# Patient Record
Sex: Female | Born: 1990
Health system: Southern US, Community
[De-identification: ages and names within clinical notes are randomized; demographics above are authoritative.]

## PROBLEM LIST (undated history)

## (undated) DIAGNOSIS — F419 Anxiety disorder, unspecified: Secondary | ICD-10-CM

## (undated) DIAGNOSIS — F410 Panic disorder [episodic paroxysmal anxiety] without agoraphobia: Secondary | ICD-10-CM

## (undated) DIAGNOSIS — F909 Attention-deficit hyperactivity disorder, unspecified type: Secondary | ICD-10-CM

## (undated) HISTORY — DX: Panic disorder (episodic paroxysmal anxiety): F41.0

## (undated) HISTORY — DX: Attention-deficit hyperactivity disorder, unspecified type: F90.9

## (undated) HISTORY — PX: NO PAST SURGERIES: SHX2092

## (undated) HISTORY — DX: Anxiety disorder, unspecified: F41.9

---
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
## 2010-02-26 ENCOUNTER — Ambulatory Visit: Payer: Self-pay

## 2010-07-15 ENCOUNTER — Ambulatory Visit: Payer: Self-pay | Admitting: Surgery

## 2011-06-10 ENCOUNTER — Emergency Department: Payer: Self-pay | Admitting: Emergency Medicine

## 2012-01-21 ENCOUNTER — Ambulatory Visit: Payer: Self-pay | Admitting: Family Medicine

## 2012-01-27 ENCOUNTER — Ambulatory Visit: Payer: Self-pay | Admitting: Family Medicine

## 2012-02-02 ENCOUNTER — Ambulatory Visit: Payer: Self-pay | Admitting: Family Medicine

## 2012-02-09 ENCOUNTER — Ambulatory Visit: Payer: Self-pay | Admitting: Internal Medicine

## 2012-02-09 LAB — CBC CANCER CENTER
Basophil #: 0 x10 3/mm (ref 0.0–0.1)
Basophil %: 0.6 %
Eosinophil %: 1.8 %
Lymphocyte %: 39.1 %
MCH: 29.7 pg (ref 26.0–34.0)
MCHC: 33 g/dL (ref 32.0–36.0)
Monocyte #: 0.3 x10 3/mm (ref 0.2–0.9)
Monocyte %: 5.1 %
Neutrophil #: 3.4 x10 3/mm (ref 1.4–6.5)
Platelet: 258 x10 3/mm (ref 150–440)
RDW: 12.6 % (ref 11.5–14.5)

## 2012-02-09 LAB — HEPATIC FUNCTION PANEL A (ARMC)
Albumin: 4.2 g/dL (ref 3.4–5.0)
Alkaline Phosphatase: 78 U/L (ref 50–136)
Bilirubin, Direct: 0.1 mg/dL (ref 0.00–0.20)
SGOT(AST): 32 U/L (ref 15–37)
SGPT (ALT): 36 U/L (ref 12–78)

## 2012-02-09 LAB — CREATININE, SERUM
Creatinine: 0.77 mg/dL (ref 0.60–1.30)
EGFR (African American): >60
EGFR (Non-African Amer.): >60

## 2012-02-09 LAB — TSH: Thyroid Stimulating Horm: 1.05 u[IU]/mL

## 2012-02-10 LAB — CEA: CEA: 1.7 ng/mL (ref 0.0–4.7)

## 2012-02-10 LAB — CA 125: CA 125: 11.6 U/mL (ref 0.0–34.0)

## 2012-02-10 LAB — AFP TUMOR MARKER: AFP-Tumor Marker: <0.7 ng/mL (ref 0.0–8.3)

## 2012-02-10 LAB — CANCER ANTIGEN 27.29: CA 27.29: 19.3 U/mL (ref 0.0–38.6)

## 2012-02-24 LAB — CBC CANCER CENTER
Basophil %: 0.5 %
Eosinophil #: 0.1 x10 3/mm (ref 0.0–0.7)
Lymphocyte #: 2.8 x10 3/mm (ref 1.0–3.6)
Lymphocyte %: 32.6 %
MCH: 31 pg (ref 26.0–34.0)
MCHC: 35.1 g/dL (ref 32.0–36.0)
Monocyte #: 0.4 x10 3/mm (ref 0.2–0.9)
Neutrophil %: 61 %
Platelet: 283 x10 3/mm (ref 150–440)
RDW: 12.3 % (ref 11.5–14.5)
WBC: 8.6 x10 3/mm (ref 3.6–11.0)

## 2012-02-24 LAB — HCG, QUANTITATIVE, PREGNANCY: Beta Hcg, Quant.: <1 m[IU]/mL — ABNORMAL LOW

## 2012-02-29 ENCOUNTER — Ambulatory Visit: Payer: Self-pay | Admitting: Internal Medicine

## 2012-03-01 LAB — CANCER ANTIGEN 19-9: CA 19-9: 20 U/mL (ref 0–35)

## 2012-03-09 ENCOUNTER — Ambulatory Visit: Payer: Self-pay | Admitting: Internal Medicine

## 2012-03-14 ENCOUNTER — Ambulatory Visit: Payer: Self-pay

## 2012-05-03 ENCOUNTER — Ambulatory Visit: Payer: Self-pay | Admitting: Internal Medicine

## 2013-03-28 DIAGNOSIS — IMO0001 Reserved for inherently not codable concepts without codable children: Secondary | ICD-10-CM | POA: Insufficient documentation

## 2013-03-28 DIAGNOSIS — K219 Gastro-esophageal reflux disease without esophagitis: Secondary | ICD-10-CM

## 2013-03-28 DIAGNOSIS — N6009 Solitary cyst of unspecified breast: Secondary | ICD-10-CM | POA: Insufficient documentation

## 2014-02-04 IMAGING — US ABDOMEN ULTRASOUND LIMITED
1 series · 14 of 25 positions shown · non-contrast
Comparison: none

REASON FOR EXAM: LUQ abd pain x 9 mos worsening in last month worsens w
exercise
COMMENTS:

PROCEDURE:     TIGER - TIGER ABDOMEN UPPER GENERAL  - January 21, 2012  [DATE]
RESULT:     Comparison: None.
TECHNIQUE: Multiple grayscale and color Doppler images were obtained of the
abdomen.

[Series 1: abdomen ultrasound limited · 0.25mm/px · 14 of 101 slices shown]
[im 1/101]
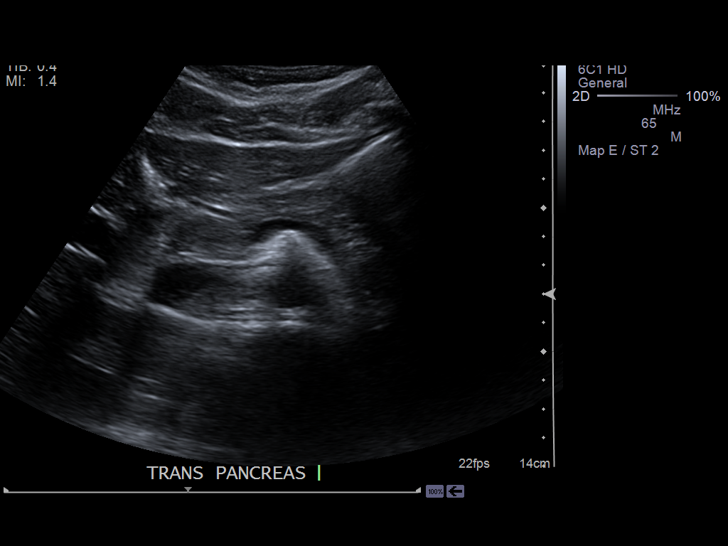
[im 9/101]
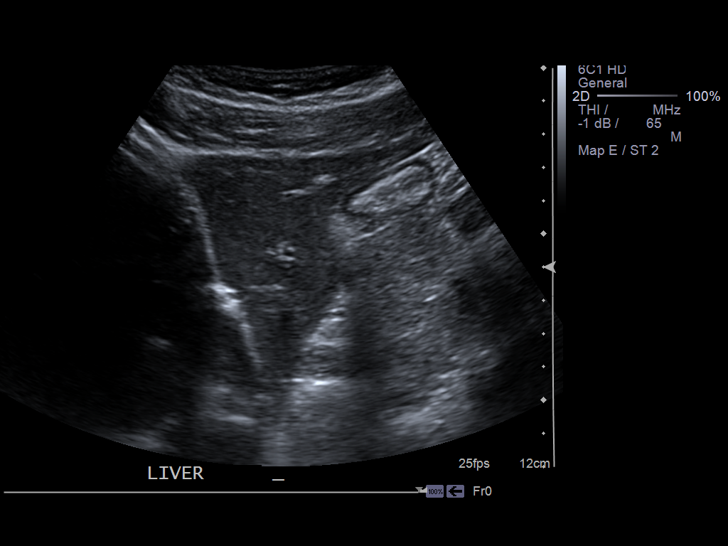
[im 17/101]
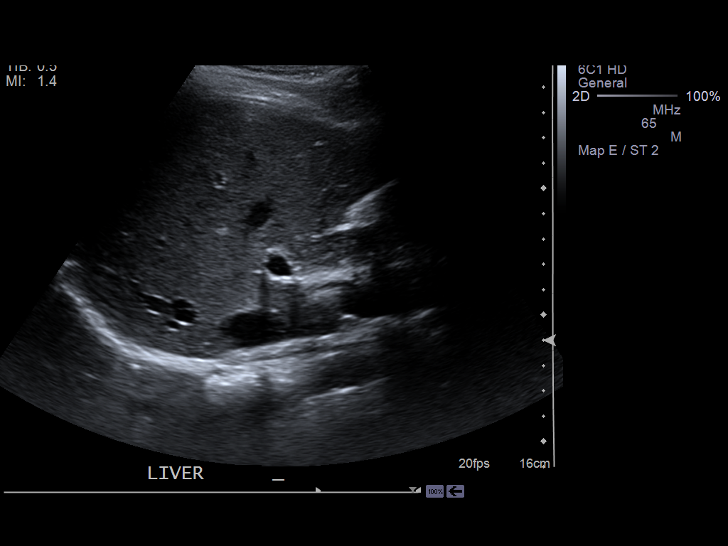
[im 26/101]
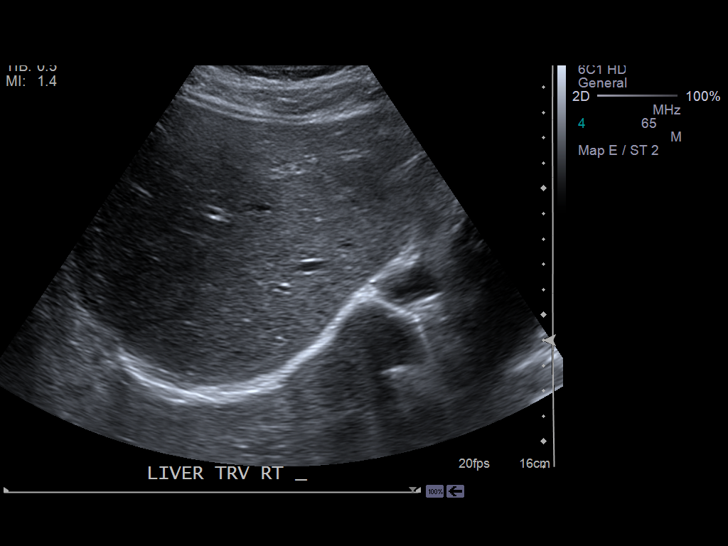
[im 34/101]
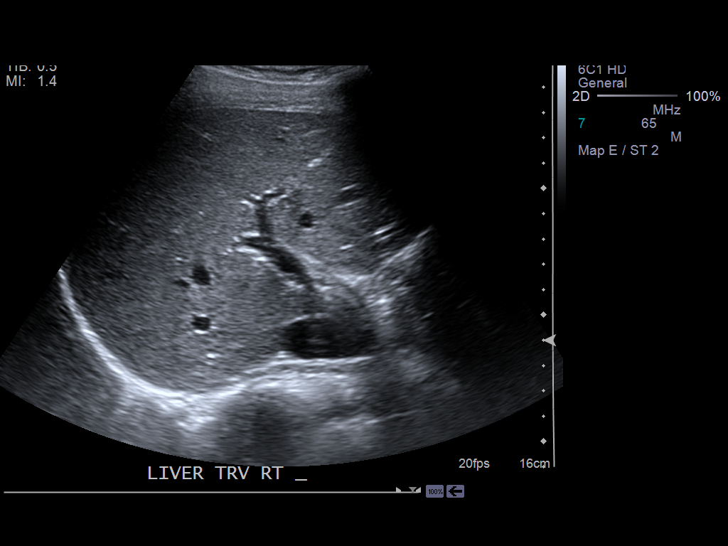
[im 38/101]
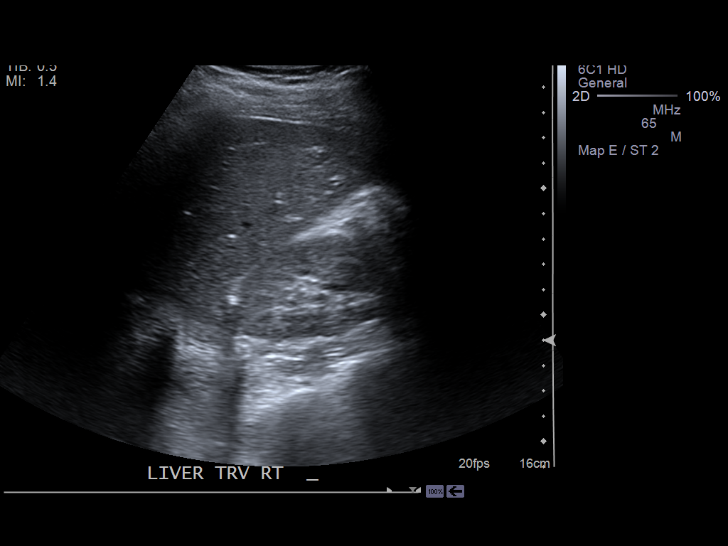
[im 46/101]
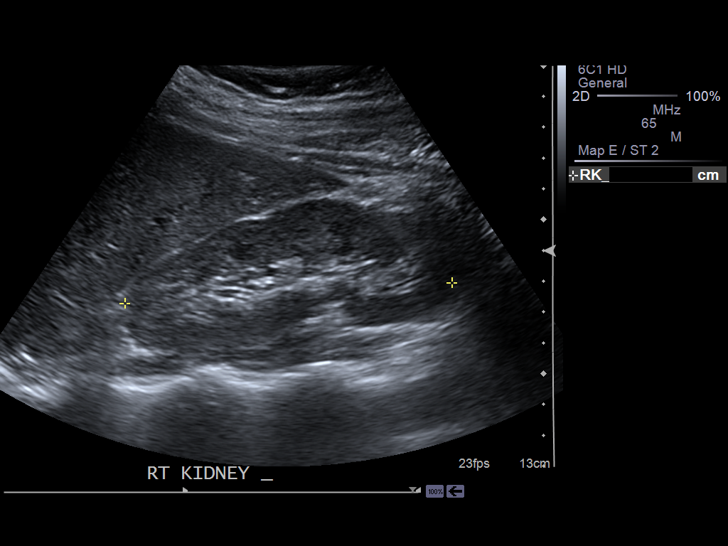
[im 55/101]
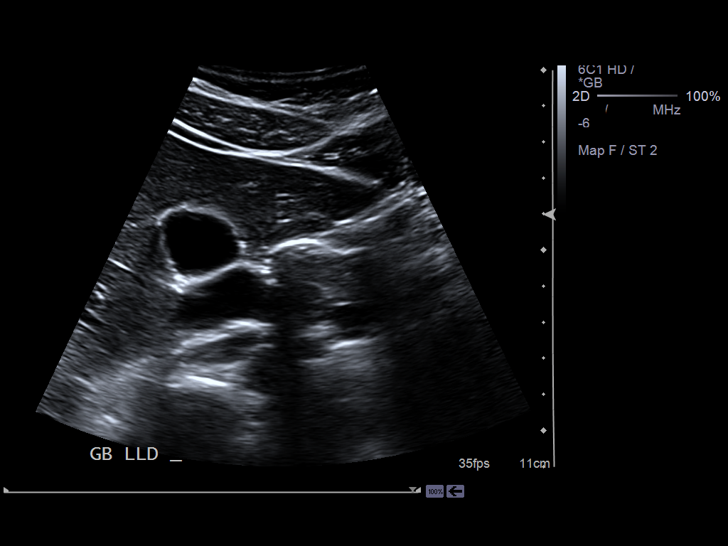
[im 63/101]
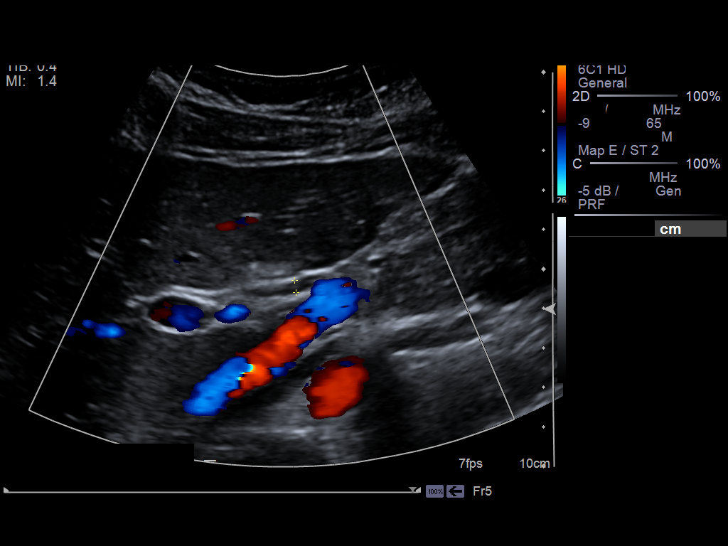
[im 67/101]
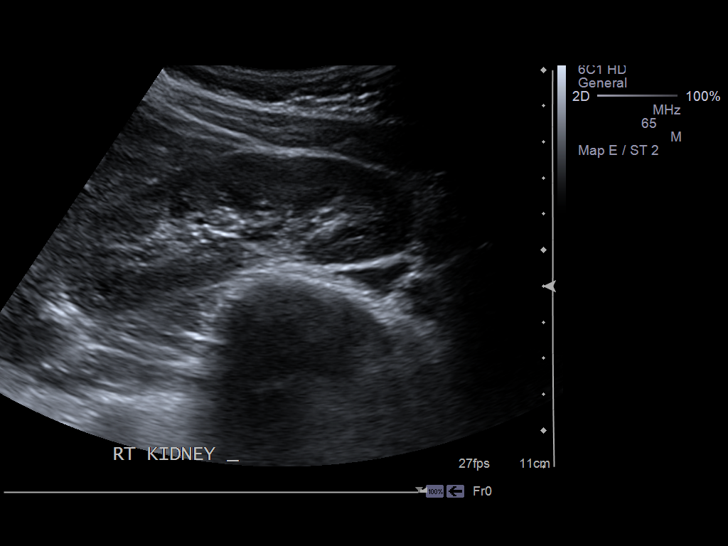
[im 76/101]
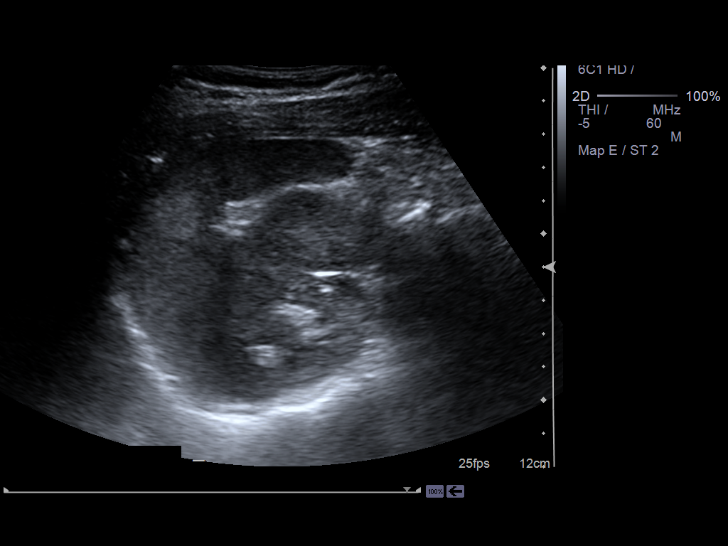
[im 84/101]
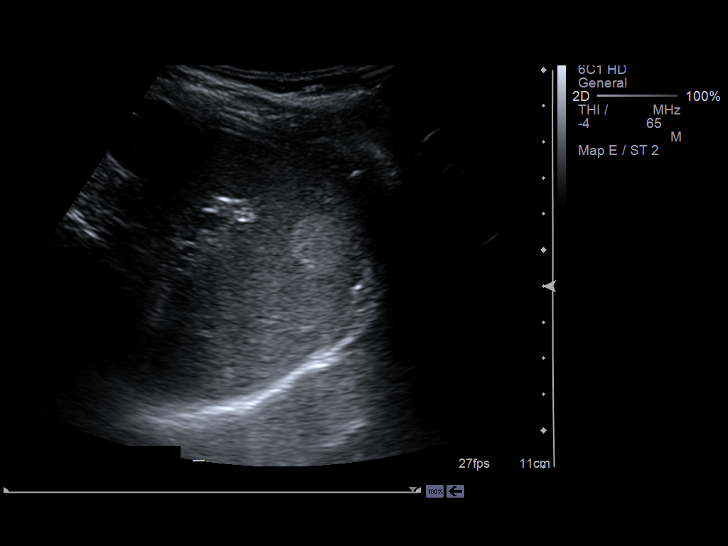
[im 92/101]
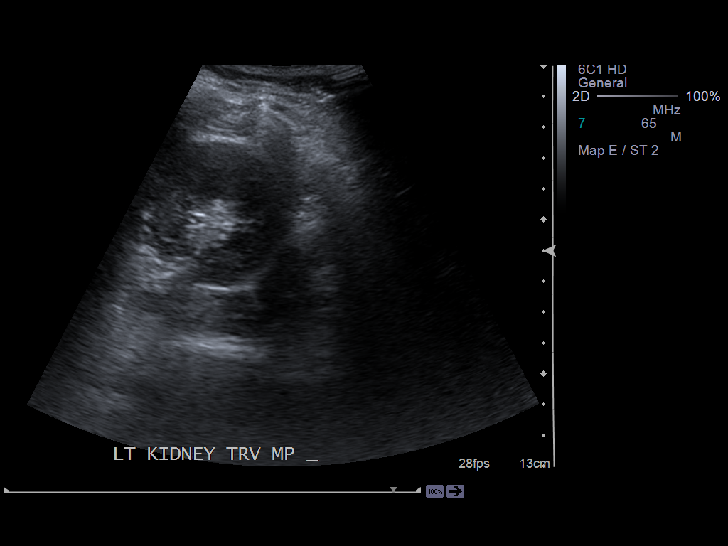
[im 101/101]
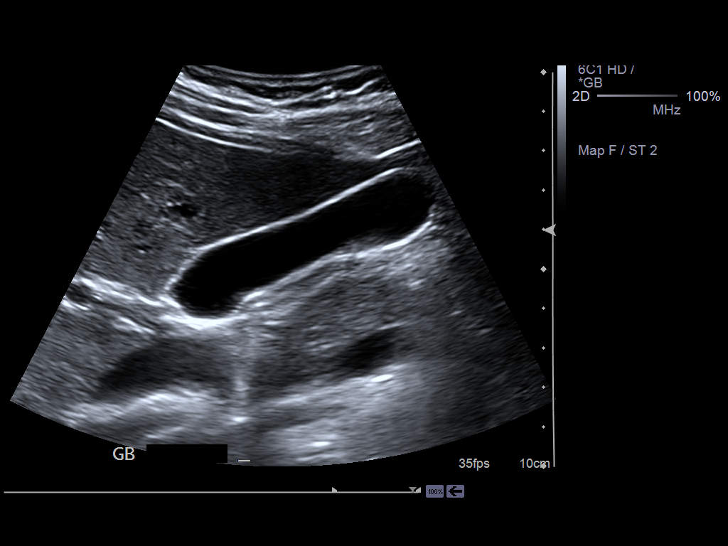

[14 of 25 positions shown; findings below may reference images not displayed]

FINDINGS: The visualized pancreas and liver are unremarkable. The main portal vein is
patent. The gallbladder is normal. Sonographic Murphy sign was negative. The
common bile duct measures 3 mm in diameter.

Images of the kidneys showed no hydronephrosis. There is a round hyperechoic
mass within the spleen. It measures 1.6 x 1.5 x 1.5 cm. On some images,
there appears to be acoustic through admission. The spleen measures 9 cm in
greatest dimension.
IMPRESSION: 1. No acute findings.
2. Small round hyperechoic mass within the spleen is nonspecific. This
likely represents a hemangioma, but is technically indeterminate. Further
evaluation with contrast-enhanced abdominal MRI is recommended.

[REDACTED]

## 2014-03-15 IMAGING — CT NM PET TUM IMG LTD AREA
1 of 5 series · 8 of 25 positions shown · non-contrast
Comparison: none

REASON FOR EXAM: abn CT spleen abd pain reflux nausea
COMMENTS:

[Series 3: ct wb 3.0 b30f · axial · 3.0mm · 0.98mm/px · z∈[-1406,-638]mm · 8 of 494 slices shown]
[im 55/494  soft-tissue]
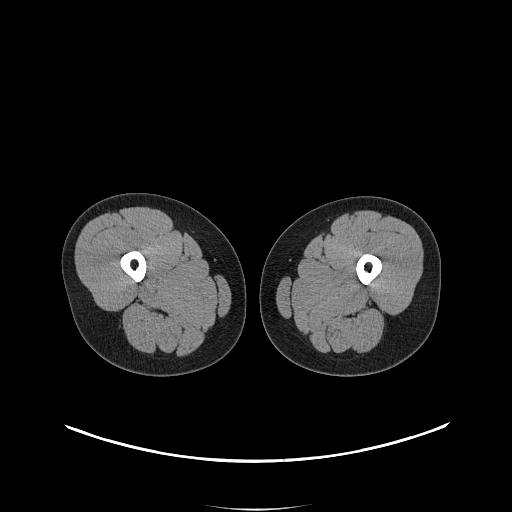
[im 110/494  soft-tissue]
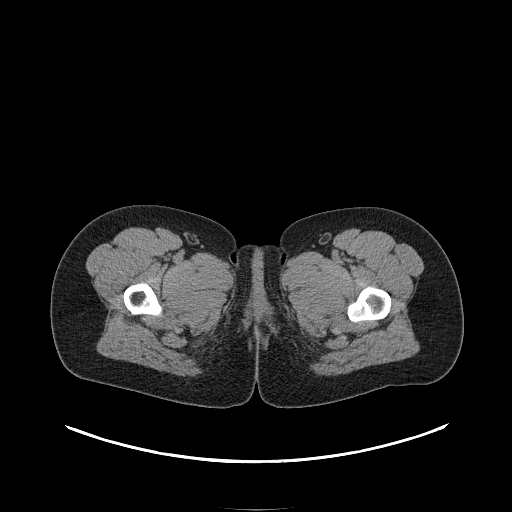
[im 165/494  soft-tissue]
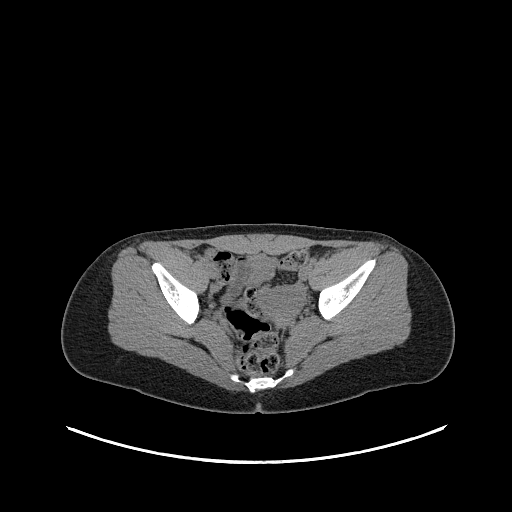
[im 220/494  soft-tissue]
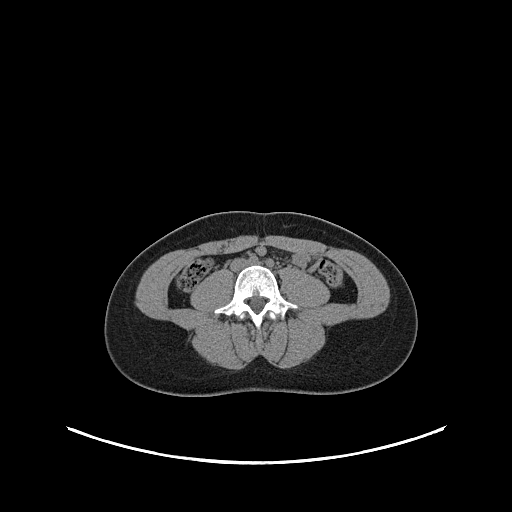
[im 274/494  soft-tissue]
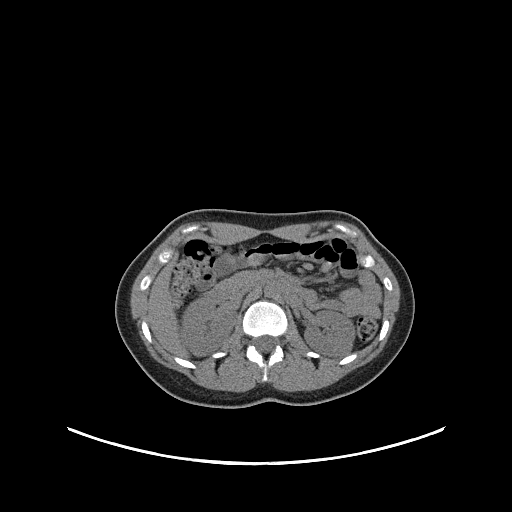
[im 329/494  soft-tissue]
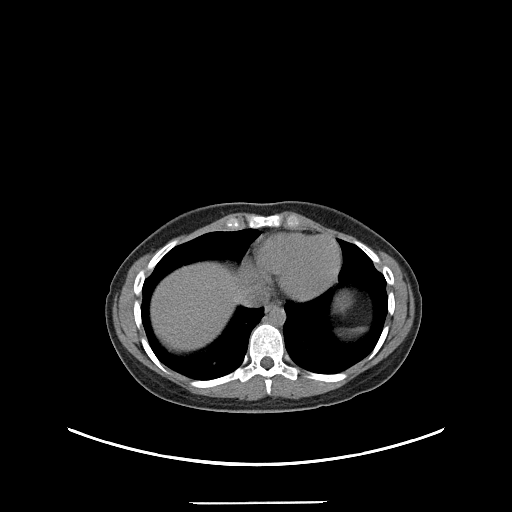
[im 384/494  soft-tissue]
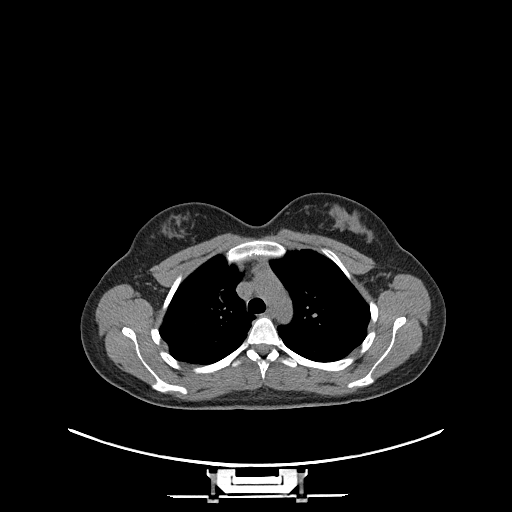
[im 439/494  soft-tissue]
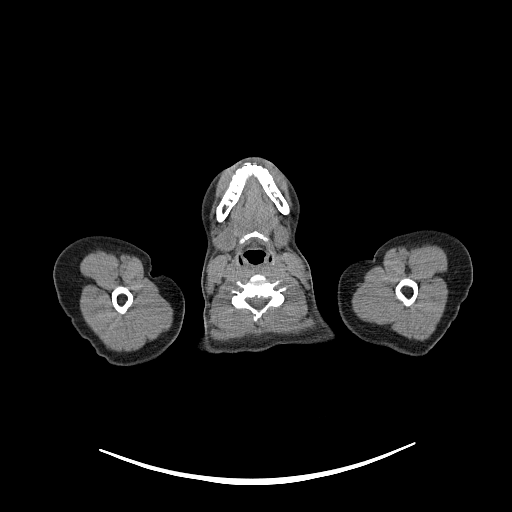

[8 of 25 positions shown; findings below may reference images not displayed]

PROCEDURE:     PET - PET/CT DX LYMPHOMA  - February 29, 2012  [DATE]

RESULT:     The patient is undergoing evaluation of possible lymphoma. The
patient's fasting blood glucose level was 91 mg/dL. The patient received
13.2 mCi of F-18 labeled FDG at [DATE] p.m. with scanning beginning at [DATE]
p.m. A noncontrast CT scan was performed at the same sitting for
coregistration and attenuation correction.

Uptake of the radiopharmaceutical within the neck is normal. Within the
thorax there is low level increased uptake in the retrosternal soft tissues.
It exhibits maximal SUV of 3 with a mean of 1.6 o hilar or subcarinal uptake
is demonstrated. There is no abnormal uptake in the pulmonary parenchyma.

Within the abdomen there is normally expected activity within the urinary
tracts. There is a tiny amount of increased uptake demonstrated at the level
of the GE junction which is felt to be physiologic. There is no abnormal
uptake within the spleen. No abnormal uptake is demonstrated in the
periaortic or pericaval regions. No significant bowel uptake is demonstrated.

On the CT images at lung window settings there is no interstitial nor
alveolar infiltrate nor pulmonary masses demonstrated. There is mildly
increased density in the retrosternal fat without evidence of a mass. No
mediastinal or hilar or axillary lymphadenopathy is demonstrated.

Within the abdomen the spleen is not enlarged. There are hypodensities
within the spleen is a been previously demonstrated which do not exhibit
abnormally increased uptake. The kidneys exhibit no acute abnormalities. The
unopacified loops of small and large bowel exhibit no acute abnormality. The
structures of the pelvis appear normal for age.
IMPRESSION: 1. I do not see abnormal uptake of the radiopharmaceutical within the neck
nor within the abdomen or pelvis.
2. There is low level increased uptake in the retrosternal fat and may
reflect pathology.
3. There is no splenomegaly. There is hypodensity within the spleen which is
nonspecific.

[REDACTED]

## 2014-05-29 LAB — HM PAP SMEAR: HM PAP: NEGATIVE

## 2014-07-16 LAB — BASIC METABOLIC PANEL
BUN: 6 mg/dL (ref 4–21)
Creatinine: 0.6 mg/dL (ref 0.5–1.1)
Glucose: 89 mg/dL
Potassium: 4.3 mmol/L (ref 3.4–5.3)
Sodium: 140 mmol/L (ref 137–147)

## 2014-07-16 LAB — HEPATIC FUNCTION PANEL
ALT: 11 U/L (ref 7–35)
AST: 13 U/L (ref 13–35)

## 2014-07-16 LAB — CBC AND DIFFERENTIAL
HEMATOCRIT: 44 % (ref 36–46)
HEMOGLOBIN: 14.9 g/dL (ref 12.0–16.0)
Platelets: 339 10*3/uL (ref 150–399)
WBC: 6.6 10^3/mL

## 2014-07-17 ENCOUNTER — Other Ambulatory Visit: Payer: Self-pay | Admitting: Family Medicine

## 2014-07-17 DIAGNOSIS — R1010 Upper abdominal pain, unspecified: Secondary | ICD-10-CM

## 2014-07-20 ENCOUNTER — Ambulatory Visit: Payer: Self-pay

## 2014-07-25 ENCOUNTER — Ambulatory Visit
Admission: RE | Admit: 2014-07-25 | Discharge: 2014-07-25 | Disposition: A | Discharge status: Home / Self Care | Payer: BLUE CROSS/BLUE SHIELD | Source: Ambulatory Visit | Attending: Family Medicine | Admitting: Family Medicine

## 2014-07-25 DIAGNOSIS — R1010 Upper abdominal pain, unspecified: Secondary | ICD-10-CM

## 2014-07-25 DIAGNOSIS — D7389 Other diseases of spleen: Secondary | ICD-10-CM | POA: Diagnosis not present

## 2014-07-25 DIAGNOSIS — R109 Unspecified abdominal pain: Secondary | ICD-10-CM | POA: Insufficient documentation

## 2014-07-25 MED ORDER — IOHEXOL 350 MG/ML SOLN
100.0000 mL | Freq: Once | INTRAVENOUS | Status: AC | PRN
  Administered 2014-07-25: 100 mL via INTRAVENOUS

## 2014-11-02 ENCOUNTER — Encounter: Payer: Self-pay | Admitting: Family Medicine

## 2014-11-02 ENCOUNTER — Ambulatory Visit (INDEPENDENT_AMBULATORY_CARE_PROVIDER_SITE_OTHER): Payer: Self-pay | Admitting: Family Medicine

## 2014-11-02 VITALS — BP 108/70 | HR 90 | Temp 98.4°F | Resp 18 | Ht 65.0 in | Wt 157.0 lb

## 2014-11-02 DIAGNOSIS — F410 Panic disorder [episodic paroxysmal anxiety] without agoraphobia: Secondary | ICD-10-CM | POA: Insufficient documentation

## 2014-11-02 DIAGNOSIS — F419 Anxiety disorder, unspecified: Secondary | ICD-10-CM | POA: Insufficient documentation

## 2014-11-02 DIAGNOSIS — I456 Pre-excitation syndrome: Secondary | ICD-10-CM

## 2014-11-02 DIAGNOSIS — R000 Tachycardia, unspecified: Secondary | ICD-10-CM

## 2014-11-02 DIAGNOSIS — E785 Hyperlipidemia, unspecified: Secondary | ICD-10-CM

## 2014-11-02 DIAGNOSIS — R002 Palpitations: Secondary | ICD-10-CM

## 2014-11-02 NOTE — Progress Notes (Signed)
Patient: Tracey Robinson Female    DOB: 06/03/1990   24 y.o.   MRN: [id number] Visit Date: 11/02/2014  Today's Provider: Mila Merry, MD   Chief Complaint  Patient presents with  . Dizziness   Subjective:    Dizziness This is a recurrent problem. The current episode started more than 1 year ago (started while in high school 6-7 years ago). The problem occurs intermittently. The problem has been gradually worsening. Pertinent negatives include no abdominal pain, anorexia, arthralgias, change in bowel habit, chest pain, chills, congestion, coughing, diaphoresis, fever, headaches, joint swelling, myalgias, nausea, neck pain, numbness, rash, sore throat, swollen glands, urinary symptoms, vertigo, visual change, vomiting or weakness. Exacerbated by: exercise.   Patient states she has noticed that when she does Cardio exercise she started to feel like she is about to pass out. Patient states most recently she was on an eliptical for 20 minutes and suddenly felt like she was about to faint. Patient states she started to get her color back, but did not pass out. Patient states her hear rate was 170 and her heart rate remained over 140 for two hours. She denies any recent change in diet, medications, or supplements. Has been trying to exercise due to high cholesterol, but has been limited by Patient states she was told by her GYN 2 years ago that she has high cholesterol. Patient states she does have a family history of heart. .      Allergies  Allergen Reactions  . Penicillins Rash   Previous Medications   ALPRAZOLAM (XANAX) 0.25 MG TABLET    Take 1 tablet by mouth every 6 (six) hours as needed.   NORETHINDRONE ACET-ETHINYL EST (MICROGESTIN 1.5/30 PO)    Take 1 tablet by mouth daily.   RANITIDINE (ZANTAC) 150 MG TABLET    Take 150 mg by mouth at bedtime.    Review of Systems  Constitutional: Negative for fever, chills and diaphoresis.  HENT: Negative for congestion and sore throat.     Respiratory: Negative for cough.   Cardiovascular: Positive for palpitations. Negative for chest pain and leg swelling.  Gastrointestinal: Negative for nausea, vomiting, abdominal pain, anorexia and change in bowel habit.  Musculoskeletal: Negative for myalgias, joint swelling, arthralgias and neck pain.  Skin: Negative for rash.  Neurological: Positive for dizziness. Negative for vertigo, syncope, weakness, numbness and headaches.    Social History  Substance Use Topics  . Smoking status: Never Smoker   . Smokeless tobacco: Not on file  . Alcohol Use: 0.0 oz/week    0 Standard drinks or equivalent per week     Comment: occasional glass of wine   Objective:   BP 108/70 mmHg  Pulse 90  Temp(Src) 98.4 F (36.9 C) (Oral)  Resp 18  Ht  (1.651 m)  Wt 157 lb (71.215 kg)  BMI 26.13 kg/m2  SpO2 98%  Physical Exam   General Appearance:    Alert, cooperative, no distress  Eyes:    PERRL, conjunctiva/corneas clear, EOM's intact       Lungs:     Clear to auscultation bilaterally, respirations unlabored  Heart:    Regular rate and rhythm, no murmurs, rubs or gallops.   Neurologic:   Awake, alert, oriented x 3. No apparent focal neurological           defect.      EKG:  Sinus  Rhythm  -Short PR syndrome  PRi = 112 BORDERLINE RHYTHM No previous EKG  available for comparison.      Assessment & Plan:     1. Palpitation  - EKG 12-Lead  2. Short PR-normal QRS complex syndrome   3. Tachycardia Possibly secondary to short PR syndrome. Check labs. If normal will refer to cardiology as symptoms are having significant morbidity impacting her ability to exercise and be physically active.  - Renal function panel - T4 AND TSH - Magnesium  4. Hyperlipidemia  - Lipid panel       Mila Merry, MD  [facility]

## 2014-11-03 ENCOUNTER — Other Ambulatory Visit: Payer: Self-pay | Admitting: Family Medicine

## 2014-11-03 DIAGNOSIS — R002 Palpitations: Secondary | ICD-10-CM

## 2014-11-03 DIAGNOSIS — I456 Pre-excitation syndrome: Secondary | ICD-10-CM

## 2014-11-03 DIAGNOSIS — R000 Tachycardia, unspecified: Secondary | ICD-10-CM

## 2014-11-03 LAB — LIPID PANEL
Chol/HDL Ratio: 4.1 ratio units (ref 0.0–4.4)
Cholesterol, Total: 236 mg/dL — ABNORMAL HIGH (ref 100–199)
HDL: 58 mg/dL (ref >39)
LDL Calculated: 150 mg/dL — ABNORMAL HIGH (ref 0–99)
TRIGLYCERIDES: 140 mg/dL (ref 0–149)
VLDL Cholesterol Cal: 28 mg/dL (ref 5–40)

## 2014-11-03 LAB — RENAL FUNCTION PANEL
Albumin: 4.2 g/dL (ref 3.5–5.5)
BUN/Creatinine Ratio: 10 (ref 8–20)
BUN: 8 mg/dL (ref 6–20)
CALCIUM: 9.5 mg/dL (ref 8.7–10.2)
CHLORIDE: 101 mmol/L (ref 97–108)
CO2: 23 mmol/L (ref 18–29)
CREATININE: 0.78 mg/dL (ref 0.57–1.00)
GFR calc Af Amer: 123 mL/min/{1.73_m2} (ref >59)
GFR calc non Af Amer: 107 mL/min/{1.73_m2} (ref >59)
GLUCOSE: 88 mg/dL (ref 65–99)
PHOSPHORUS: 3.2 mg/dL (ref 2.5–4.5)
POTASSIUM: 4.3 mmol/L (ref 3.5–5.2)
SODIUM: 139 mmol/L (ref 134–144)

## 2014-11-03 LAB — T4 AND TSH
T4 TOTAL: 10.7 ug/dL (ref 4.5–12.0)
TSH: 1.91 u[IU]/mL (ref 0.450–4.500)

## 2014-11-03 LAB — MAGNESIUM: Magnesium: 2.1 mg/dL (ref 1.6–2.3)

## 2014-11-03 NOTE — Progress Notes (Unsigned)
Please refer cardiology for episodic tachycardia and short PR syndrome.

## 2014-11-05 ENCOUNTER — Telehealth: Payer: Self-pay | Admitting: Family Medicine

## 2014-11-05 ENCOUNTER — Telehealth: Payer: Self-pay

## 2014-11-05 NOTE — Telephone Encounter (Signed)
Please advise patient of result note. Thanks.

## 2014-11-05 NOTE — Telephone Encounter (Signed)
Patient Advised about the labs. 11/05/14 Cholesterol is moderately elevated at 236, but not in range that medications are recommended. Other labs are normal. Need to go ahead and refer to cardiologist regarding tachycardia and short PR syndrome. Have sent referral order to sar[facility].  Thanks,  -Jadeyn Hargett

## 2014-11-05 NOTE — Telephone Encounter (Signed)
Pt would like to get results from blood work °

## 2014-11-06 DIAGNOSIS — R000 Tachycardia, unspecified: Secondary | ICD-10-CM | POA: Insufficient documentation

## 2014-11-27 ENCOUNTER — Other Ambulatory Visit: Payer: Self-pay

## 2014-11-29 ENCOUNTER — Encounter: Payer: Self-pay | Admitting: Gastroenterology

## 2014-11-29 ENCOUNTER — Ambulatory Visit (INDEPENDENT_AMBULATORY_CARE_PROVIDER_SITE_OTHER): Payer: Commercial Managed Care - HMO | Admitting: Gastroenterology

## 2014-11-29 VITALS — BP 104/68 | HR 97 | Temp 98.5°F | Ht 65.0 in | Wt 158.0 lb

## 2014-11-29 DIAGNOSIS — R1012 Left upper quadrant pain: Secondary | ICD-10-CM | POA: Diagnosis not present

## 2014-11-29 MED ORDER — PANTOPRAZOLE SODIUM 40 MG PO TBEC: 40.0000 mg | DELAYED_RELEASE_TABLET | Freq: Every day | ORAL | Status: DC

## 2014-11-29 NOTE — Progress Notes (Signed)
Gastroenterology Consultation  Referring Provider:     No ref. provider found Primary Care Physician:  Mila Merry, MD Primary Gastroenterologist:  Dr. Servando Snare     Reason for Consultation:     Abdominal pain and heartburn        HPI:   Tracey Robinson is a 24 y.o. y/o female referred for consultation & management of abdominal pain and heartburn by Dr. Mila Merry, MD.  This patient comes in today with a report of left-sided abdominal pain with heartburn. The patient states that when she lays on her stomach she feels that there is a bulge there and she thinks her stomach is swollen. The patient had imaging that did not show any abnormality in her stomach or intestines to explain the sensation she is having. The patient denies any unexplained weight loss. She does report that she has some bloating. There is no report of any black stools or bloody stools. The patient takes Zantac before she goes to sleep and reports that she doesn't take it she has more symptoms at night. She reports that she has had these symptoms for a few years. 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  Past Medical History  Diagnosis Date  . Anxiety   . Panic attack     History reviewed. No pertinent past surgical history.  Prior to Admission medications   Medication Sig Start Date End Date Taking? Authorizing Provider  ALPRAZolam (XANAX) 0.25 MG tablet Take 1 tablet by mouth every 6 (six) hours as needed. 07/16/14  Yes Historical Provider, MD  fexofenadine (ALLEGRA) 180 MG tablet Take by mouth.   Yes Historical Provider, MD  fluticasone (FLONASE) 50 MCG/ACT nasal spray Place into the nose.   Yes Historical Provider, MD  MICROGESTIN FE 1.5/30 1.5-30 MG-MCG tablet TK 1 T PO QD 09/17/14  Yes Historical Provider, MD  ranitidine (ZANTAC) 150 MG tablet Take 150 mg by mouth at bedtime.   Yes Historical Provider, MD  loratadine (CLARITIN) 10 MG tablet Take by mouth.    Historical Provider, MD  mometasone (NASONEX) 50  MCG/ACT nasal spray Place into the nose. 05/29/14 05/29/15  Historical Provider, MD  pantoprazole (PROTONIX) 40 MG tablet Take 1 tablet (40 mg total) by mouth daily. 11/29/14   Midge Minium, MD    Family History  Problem Relation Age of Onset  . Heart attack Paternal Grandfather   . Ulcers Father   . Heart attack Father   . Diabetes Paternal Grandfather   . Anxiety disorder Paternal Grandmother   . Anxiety disorder Father   . Hyperlipidemia Mother      Social History  Substance Use Topics  . Smoking status: Never Smoker   . Smokeless tobacco: Never Used  . Alcohol Use: 0.0 oz/week    0 Standard drinks or equivalent per week     Comment: occasional glass of wine    Allergies as of 11/29/2014 - Review Complete 11/02/2014  Allergen Reaction Noted  . Penicillins Rash 07/25/2014    Review of Systems:    All systems reviewed and negative except where noted in HPI.   Physical Exam:  BP 104/68 mmHg  Pulse 97  Temp(Src) 98.5 F (36.9 C) (Oral)  Ht  (1.651 m)  Wt 158 lb (71.668 kg)  BMI 26.29 kg/m2 No LMP recorded. Psych:  Alert and cooperative. Normal mood and affect. General:   Alert,  Well-developed, well-nourished, pleasant and cooperative in NAD Head:  Normocephalic and atraumatic. Eyes:  Sclera clear, no icterus.   Conjunctiva pink. Ears:  Normal auditory acuity. Nose:  No deformity, discharge, or lesions. Mouth:  No deformity or lesions,oropharynx pink & moist. Neck:  Supple; no masses or thyromegaly. Lungs:  Respirations even and unlabored.  Clear throughout to auscultation.   No wheezes, crackles, or rhonchi. No acute distress. Heart:  Regular rate and rhythm; no murmurs, clicks, rubs, or gallops. Abdomen:  Normal bowel sounds.  No bruits.  Soft, Mild tenderness in the left upper quadrant ,non-distended without masses, hepatosplenomegaly or hernias noted.  No guarding or rebound tenderness.  Negative Carnett sign.   Rectal:  Deferred.  Msk:  Symmetrical without  gross deformities.  Good, equal movement & strength bilaterally. Pulses:  Normal pulses noted. Extremities:  No clubbing or edema.  No cyanosis. Neurologic:  Alert and oriented x3;  grossly normal neurologically. Skin:  Intact without significant lesions or rashes.  No jaundice. Lymph Nodes:  No significant cervical adenopathy. Psych:  Alert and cooperative. Normal mood and affect.  Imaging Studies: No results found.  Assessment and Plan:   Tracey Robinson is a 24 y.o. y/o female who comes today with a report of a swollen stomach although the CT scan did not show any sign of intestinal swelling. The patient takes Zantac with some relief of her symptoms but continues to have pain in the left upper quadrant. The pain is worse with fatty foods and spicy foods. The patient will be started on a trial of Protonix. If her symptoms do not improve she may need to be set up for an upper endoscopy and will be considered for a right upper quadrant ultrasound with a HIDA scan. A right upper quadrant ultrasound and HIDA scan for some fat intolerance and intermittent right upper quadrant pain.  Note: This dictation was prepared with Dragon dictation along with smaller phrase technology. Any transcriptional errors that result from this process are unintentional.

## 2015-01-01 ENCOUNTER — Encounter: Payer: Self-pay | Admitting: Family Medicine

## 2015-01-01 ENCOUNTER — Ambulatory Visit (INDEPENDENT_AMBULATORY_CARE_PROVIDER_SITE_OTHER): Payer: Commercial Managed Care - HMO | Admitting: Family Medicine

## 2015-01-01 VITALS — BP 100/66 | HR 90 | Temp 98.7°F | Resp 16 | Ht 64.5 in | Wt 158.0 lb

## 2015-01-01 DIAGNOSIS — Z23 Encounter for immunization: Secondary | ICD-10-CM | POA: Diagnosis not present

## 2015-01-01 DIAGNOSIS — F410 Panic disorder [episodic paroxysmal anxiety] without agoraphobia: Secondary | ICD-10-CM

## 2015-01-01 DIAGNOSIS — Z3009 Encounter for other general counseling and advice on contraception: Secondary | ICD-10-CM

## 2015-01-01 DIAGNOSIS — Z0000 Encounter for general adult medical examination without abnormal findings: Secondary | ICD-10-CM | POA: Diagnosis not present

## 2015-01-01 LAB — POCT URINALYSIS DIPSTICK
Bilirubin, UA: NEGATIVE
Blood, UA: NEGATIVE
Glucose, UA: NEGATIVE
KETONES UA: NEGATIVE
LEUKOCYTES UA: NEGATIVE
Nitrite, UA: NEGATIVE
PH UA: 7
PROTEIN UA: NEGATIVE
SPEC GRAV UA: 1.015
UROBILINOGEN UA: 0.2

## 2015-01-01 MED ORDER — DESOGESTREL-ETHINYL ESTRADIOL 0.15-0.02/0.01 MG (21/5) PO TABS: 1.0000 | ORAL_TABLET | Freq: Every day | ORAL | Status: DC

## 2015-01-01 NOTE — Progress Notes (Signed)
Patient: Tracey Robinson, Female    DOB: [birth year], 24 y.o.   MRN: [id number] Visit Date: 01/01/2015  Today's Provider: Lelon Huh, MD   Chief Complaint  Patient presents with  . Annual Exam  . Panic Attack    follow up   Subjective:    Annual physical exam  Tracey Robinson is a 24 y.o. female who presents today for health maintenance and complete physical. She feels well. She reports exercising 3 times a week (yoga). She reports she is sleeping poorly. 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 She states she had gyn exam and pap test done in February with her gynecologist.   She states she would like to try a different birth control pill that would lessen mood swings and severity of menstrual cycle. She has not other adverse effects from her current birth control pill.   -----------------------------------------------------------------   Follow up Anxiety/ Panic Attacks Last office visit was 5 months ago. Changes made during this visit includes prescribing as needed Xanax. Patient states she does not take them regularly. She states she may take it once a month. Patient states states she like to have it just in case she needs it. 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   Review of Systems  Constitutional: Negative for fever, chills and fatigue.  HENT: Negative for congestion, ear pain, rhinorrhea, sneezing and sore throat.   Eyes: Negative.  Negative for pain and redness.  Respiratory: Negative for cough, shortness of breath and wheezing.   Cardiovascular: Negative for chest pain and leg swelling.  Gastrointestinal: Negative for nausea, abdominal pain, diarrhea, constipation and blood in stool.  Endocrine: Negative for polydipsia and polyphagia.  Genitourinary: Negative.  Negative for dysuria, hematuria, flank pain, vaginal bleeding, vaginal discharge and pelvic pain.  Musculoskeletal: Negative for back  pain, joint swelling, arthralgias and gait problem.  Skin: Negative for rash.  Neurological: Negative.  Negative for dizziness, tremors, seizures, weakness, light-headedness, numbness and headaches.  Hematological: Negative for adenopathy.  Psychiatric/Behavioral: Positive for sleep disturbance (trouble staying asleep, falling asleep and sleeping too much). Negative for behavioral problems, confusion and dysphoric mood. The patient is nervous/anxious. The patient is not hyperactive.     Social History She  reports that she has never smoked. She has never used smokeless tobacco. She reports that she drinks alcohol. She reports that she does not use illicit drugs. Social History   Social History  . Marital Status: Single    Spouse Name: N/A  . Number of Children: 0  . Years of Education: N/A   Occupational History  . 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 Smoking status: Never Smoker   . Smokeless tobacco: Never Used  . Alcohol Use: 0.0 oz/week    0 Standard drinks or equivalent per week     Comment: occasional glass of wine  . Drug Use: No  . Sexual Activity: Not Asked   Other Topics Concern  . None   Social History Narrative    Patient Active Problem List   Diagnosis Date Noted  . Abdominal pain 11/27/2014  . Fast heart beat 11/06/2014  . Panic attack 11/02/2014  . Anxiety 11/02/2014  . Panic disorder without agoraphobia 11/02/2014  . Reflux 03/28/2013  . Benign cyst of breast 03/28/2013    History reviewed. No pertinent past surgical history.  Family History  Family Status  Relation Status Death Age  . Mother Alive   . Father Alive    Her family history includes Anxiety disorder in her father and paternal grandmother; Diabetes in her paternal grandfather; Heart attack in her father and paternal grandfather; Hyperlipidemia in her mother; Ulcers in her father.    Allergies  Allergen Reactions  . Penicillins Rash     Previous Medications   ALPRAZOLAM (XANAX) 0.25 MG TABLET    Take 1 tablet by mouth every 6 (six) hours as needed.   FLUTICASONE (FLONASE) 50 MCG/ACT NASAL SPRAY    Place into the nose.   LORATADINE (CLARITIN) 10 MG TABLET    Take by mouth.   MICROGESTIN FE 1.5/30 1.5-30 MG-MCG TABLET    TK 1 T PO QD   PANTOPRAZOLE (PROTONIX) 40 MG TABLET    Take 1 tablet (40 mg total) by mouth daily.   RANITIDINE (ZANTAC) 150 MG TABLET    Take 150 mg by mouth at bedtime.    Patient Care Team: Birdie Sons, MD as PCP - General (Family Medicine)     Objective:   Vitals: BP 100/66 mmHg  Pulse 90  Temp(Src) 98.7 F (37.1 C) (Oral)  Resp 16  Ht 5' 4.5" (1.638 m)  Wt 158 lb (71.668 kg)  BMI 26.71 kg/m2  SpO2 97%  LMP 12/04/2014   Physical Exam   General Appearance:    Alert, cooperative, no distress  Eyes:    PERRL, conjunctiva/corneas clear, EOM's intact       Lungs:     Clear to auscultation bilaterally, respirations unlabored  Heart:    Regular rate and rhythm  Neurologic:   Awake, alert, oriented x 3. No apparent focal neurological           defect.       Depression Screen No flowsheet data found.    Assessment & Plan:     Routine Health Maintenance and Physical Exam  Exercise Activities and Dietary recommendations Goals    None      Immunization History  Administered Date(s) Administered  . DTaP 10/08/1990, 12/08/1990, 02/15/1991, 11/15/1991, 07/14/1995  . HPV Quadrivalent 05/27/2006, 07/29/2006, 12/02/2006  . Hepatitis A 05/27/2006, 12/02/2006  . Hepatitis B 07/14/1995, 09/15/1995, 07/03/1996  . HiB (PRP-OMP) 02/15/1991, 11/15/1991  . IPV 10/08/1990, 12/08/1990, 11/15/1991, 07/14/1995  . MMR 11/15/1991, 07/14/1995  . Meningococcal Conjugate 07/29/2006  . Td 07/29/2006  . Tdap 07/29/2006    Health Maintenance  Topic Date Due  . HIV Screening  07/08/2005  . PAP SMEAR  07/09/2011  . INFLUENZA VACCINE  10/08/2014      Discussed health benefits of physical  activity, and encouraged her to engage in regular exercise appropriate for her age and condition.    --------------------------------------------------------------------  1. Annual physical exam Completed health forms for [employer] [facility] nursing school - POCT Urinalysis Dipstick  2. Encounter for other general counseling or advice on contraception Change OCP to try to lessen menstrual symptoms - desogestrel-ethinyl estradiol (KARIVA,AZURETTE,MIRCETTE) 0.15-0.02/0.01 MG (21/5) tablet; Take 1 tablet by mouth daily.  Dispense: 1 Package; Refill: 11  3. Need for influenza vaccination  - Flu Vaccine QUAD 36+ mos PF IM (Fluarix & Fluzone Quad PF)  4. Panic attack Doing well, rarely requiring alprazolam.

## 2015-05-25 ENCOUNTER — Other Ambulatory Visit: Payer: Self-pay | Admitting: Family Medicine

## 2015-05-26 NOTE — Telephone Encounter (Signed)
Please call in alprazolam.  

## 2015-05-27 NOTE — Telephone Encounter (Signed)
Rx called in to pharmacy. 

## 2015-06-28 ENCOUNTER — Other Ambulatory Visit: Payer: Self-pay | Admitting: Gastroenterology

## 2016-10-21 ENCOUNTER — Ambulatory Visit: Payer: Self-pay | Admitting: Physician Assistant

## 2017-02-25 ENCOUNTER — Encounter: Payer: Self-pay | Admitting: Family Medicine

## 2017-02-25 ENCOUNTER — Ambulatory Visit (INDEPENDENT_AMBULATORY_CARE_PROVIDER_SITE_OTHER): Payer: 59 | Admitting: Family Medicine

## 2017-02-25 DIAGNOSIS — F418 Other specified anxiety disorders: Secondary | ICD-10-CM | POA: Diagnosis not present

## 2017-02-25 DIAGNOSIS — N761 Subacute and chronic vaginitis: Secondary | ICD-10-CM | POA: Diagnosis not present

## 2017-02-25 DIAGNOSIS — N760 Acute vaginitis: Secondary | ICD-10-CM | POA: Insufficient documentation

## 2017-02-25 MED ORDER — ESCITALOPRAM OXALATE 10 MG PO TABS: ORAL_TABLET | ORAL | 2 refills | Status: DC

## 2017-02-25 MED ORDER — ALPRAZOLAM 0.25 MG PO TABS: 0.2500 mg | ORAL_TABLET | Freq: Four times a day (QID) | ORAL | 1 refills | Status: DC | PRN

## 2017-02-25 MED ORDER — FLUCONAZOLE 150 MG PO TABS: 150.0000 mg | ORAL_TABLET | Freq: Once | ORAL | 0 refills | Status: AC

## 2017-02-25 MED ORDER — METRONIDAZOLE 500 MG PO TABS: 500.0000 mg | ORAL_TABLET | Freq: Two times a day (BID) | ORAL | 0 refills | Status: DC

## 2017-02-25 NOTE — Progress Notes (Signed)
Patient: Tracey Robinson Female    DOB: 01/18/1991   26 y.o.   MRN: [id number] Visit Date: 02/25/2017  Today's Provider: Mila Merryonald Fifi Schindler, MD   Chief Complaint  Patient presents with  . Anxiety   Subjective:    HPI Anxiety:  Patient comes in today stating that she has been more anxious since changing her job. She feels nervous and anxious because of new duties that she will be responsible for. 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 She states her sleep is interrupted by dreams related to job and she has trouble focusing and concentrating from worrying about it  She has history of anxiety related to school work in the past and was treated with escitalopram a few years ago. She states this worked very well for her. She rarely takes 1/2 alprazolam for acute anxiety attacks.    Vaginal Tingling:  Patient reports that she has a history of chronic Bacterial Vaginosis that is usually treated by her GYN.  She has redness in the vaginal area and tingling sensation similar to previous infections that responded well to oral metronidazole. She is planning on following up with gyn next month.    Allergies  Allergen Reactions  . Penicillins Rash     Current Outpatient Medications:  .  ALPRAZolam (XANAX) 0.25 MG tablet, Take 1 tablet (0.25 mg total) by mouth every 6 (six) hours as needed., Disp: 30 tablet, Rfl: 1 .  fluticasone (FLONASE) 50 MCG/ACT nasal spray, Place into the nose., Disp: , Rfl:    Review of Systems  Constitutional: Negative for appetite change, chills, fatigue and fever.  Respiratory: Negative for chest tightness and shortness of breath.   Cardiovascular: Negative for chest pain and palpitations.  Gastrointestinal: Negative for abdominal pain, nausea and vomiting.  Genitourinary:       Tingling feeling in vaginal area  Neurological: Negative for dizziness and weakness.  Psychiatric/Behavioral: Positive for sleep disturbance. The patient is  nervous/anxious.     Social History   Tobacco Use  . Smoking status: Never Smoker  . Smokeless tobacco: Never Used  Substance Use Topics  . Alcohol use: Yes    Alcohol/week: 0.0 oz    Comment: occasional glass of wine   Objective:   BP 100/60   Pulse 88   Temp 98.1 F (36.7 C) (Oral)   Resp 16   Wt 164 lb (74.4 kg)   BMI 27.72 kg/m  There were no vitals filed for this visit.   Physical Exam  General appearance: alert, well developed, well nourished, cooperative and in no distress Head: Normocephalic, without obvious abnormality, atraumatic Respiratory: Respirations even and unlabored, normal respiratory rate Extremities: No gross deformities Skin: Skin color, texture, turgor normal. No rashes seen  Psych: Appropriate mood and affect. Neurologic: Mental status: Alert, oriented to person, place, and time, thought content appropriate.      Assessment & Plan:     1. Situational anxiety She did well with escitalopram in the past. Will restart medication - escitalopram (LEXAPRO) 10 MG tablet; 1/2 tablet daily for 6 days, then increase to 1 tablet daily  Dispense: 30 tablet; Refill: 2 - ALPRAZolam (XANAX) 0.25 MG tablet; Take 1 tablet (0.25 mg total) by mouth every 6 (six) hours as needed.  Dispense: 30 tablet; Refill: 1  2. Chronic vaginitis Discussed various causes of recurrent vagitis. Treat with metronidazole today which she has reponded well to in the past. She states these are  always followed by yeast infection so sent prescription for fluconazole. She is to follow up with gyn for further evaluation.  - metroNIDAZOLE (FLAGYL) 500 MG tablet; Take 1 tablet (500 mg total) by mouth 2 (two) times daily.  Dispense: 14 tablet; Refill: 0 - fluconazole (DIFLUCAN) 150 MG tablet; Take 1 tablet (150 mg total) by mouth once for 1 dose.  Dispense: 1 tablet; Refill: 0  .  Over half of this 25 minute visit were spent in counseling and coordinating care of multiple medical problems.        Mila Merryonald Aamya Orellana, MD  [facility]

## 2017-02-26 ENCOUNTER — Other Ambulatory Visit: Payer: Self-pay

## 2017-02-26 ENCOUNTER — Ambulatory Visit
Admission: EM | Admit: 2017-02-26 | Discharge: 2017-02-26 | Disposition: A | Discharge status: Home / Self Care | Payer: 59 | Attending: Family | Admitting: Family

## 2017-02-26 ENCOUNTER — Encounter: Payer: Self-pay | Admitting: Family Medicine

## 2017-02-26 DIAGNOSIS — N760 Acute vaginitis: Secondary | ICD-10-CM

## 2017-02-26 DIAGNOSIS — R51 Headache: Secondary | ICD-10-CM

## 2017-02-26 DIAGNOSIS — B9689 Other specified bacterial agents as the cause of diseases classified elsewhere: Secondary | ICD-10-CM

## 2017-02-26 DIAGNOSIS — R519 Headache, unspecified: Secondary | ICD-10-CM

## 2017-02-26 MED ORDER — KETOROLAC TROMETHAMINE 60 MG/2ML IM SOLN
60.0000 mg | Freq: Once | INTRAMUSCULAR | Status: AC
  Administered 2017-02-26: 60 mg via INTRAMUSCULAR

## 2017-02-26 MED ORDER — METRONIDAZOLE 0.75 % VA GEL: 1.0000 | Freq: Every day | VAGINAL | 0 refills | Status: AC

## 2017-02-26 NOTE — ED Triage Notes (Signed)
Patient complains of headache that started this morning. Patient states that's she may be having a side effect from Flagyl or Lexapro. Patient states that she started them both yestedray.

## 2017-02-26 NOTE — ED Provider Notes (Signed)
[facility]    CSN: [id number] Arrival date & time: 02/26/17  1436     History   Chief Complaint Chief Complaint  Patient presents with  . Headache    HPI Tracey Robinson is a 26 y.o. female.   26 year old female, accompanied by both parents, presents with headache that started early this morning. Pain is located around her eyes and also at the back of her neck. She is also experiencing nausea and photophobia but no vomiting, diarrhea, fever or URI symptoms. She saw her PCP yesterday and was dx with bacterial vaginitis and recurrence of anxiety. They started her back on Lexapro yesterday as well as on oral Flagyl for BV. She took both of them together last night and then the headache woke her up early this morning. She has taken Tylenol and Ibuprofen with minimal relief. She has had mild headaches in the past in which she either rested or took Tylenol and the pain resolved but this headache is more severe and lasting longer. She also takes Xanax but has not taken this med in many days. She is wondering if the headache is caused by the combination of oral Lexapro and Flagyl medications. She just finished her menses 3 days ago and is not on any hormonal regulation of her period. Other chronic health issues include occasional allergies and takes Flonase as needed.    The history is provided by the patient.    Past Medical History:  Diagnosis Date  . Anxiety   . Panic attack     Patient Active Problem List   Diagnosis Date Noted  . Situational anxiety 02/25/2017  . Vaginitis 02/25/2017  . Fast heart beat 11/06/2014  . Panic attack 11/02/2014  . Anxiety 11/02/2014  . Reflux 03/28/2013  . Benign cyst of breast 03/28/2013    Past Surgical History:  Procedure Laterality Date  . NO PAST SURGERIES      OB History    Gravida Para Term Preterm AB Living   0 0 0 0 0 0   SAB TAB Ectopic Multiple Live Births   0 0 0 0         Home Medications    Prior to  Admission medications   Medication Sig Start Date End Date Taking? Authorizing Provider  ALPRAZolam (XANAX) 0.25 MG tablet Take 1 tablet (0.25 mg total) by mouth every 6 (six) hours as needed. 02/25/17  Yes Malva LimesFisher, Donald E, MD  escitalopram (LEXAPRO) 10 MG tablet 1/2 tablet daily for 6 days, then increase to 1 tablet daily 02/25/17  Yes Malva LimesFisher, Donald E, MD  fluticasone [facility](FLONASE) 50 MCG/ACT nasal spray Place into the nose.   Yes [provider]  metroNIDAZOLE (METROGEL) 0.75 % vaginal gel Place 1 Applicatorful vaginally at bedtime for 5 days. 02/26/17 03/03/17  Sudie GrumblingAmyot, Lliam Hoh Berry, NP    Family History Family History  Problem Relation Age of Onset  . Hyperlipidemia Mother   . Ulcers Father   . Heart attack Father   . Anxiety disorder Father   . Heart attack Paternal Grandfather   . Diabetes Paternal Grandfather   . Anxiety disorder Paternal Grandmother     Social History Social History   Tobacco Use  . Smoking status: Never Smoker  . Smokeless tobacco: Never Used  Substance Use Topics  . Alcohol use: Yes    Alcohol/week: 0.0 oz    Comment: occasional glass of wine  . Drug use: No     Allergies   Penicillins  Review of Systems Review of Systems  Constitutional: Negative for activity change, appetite change, chills, fatigue and fever.  HENT: Negative for congestion, ear discharge, ear pain, facial swelling, mouth sores, nosebleeds, postnasal drip, rhinorrhea, sinus pressure, sinus pain, sneezing, sore throat and trouble swallowing.   Eyes: Positive for photophobia. Negative for pain, discharge, redness, itching and visual disturbance.  Respiratory: Negative for cough, chest tightness, shortness of breath and wheezing.   Cardiovascular: Negative for chest pain.  Gastrointestinal: Positive for nausea. Negative for abdominal pain, diarrhea and vomiting.  Genitourinary: Positive for vaginal discharge (being treated for BV). Negative for menstrual problem.    Musculoskeletal: Positive for neck pain. Negative for arthralgias and myalgias.  Skin: Negative for rash and wound.  Neurological: Positive for headaches. Negative for dizziness, tremors, seizures, syncope, facial asymmetry, speech difficulty, weakness, light-headedness and numbness.  Hematological: Negative for adenopathy. Does not bruise/bleed easily.     Physical Exam Triage Vital Signs ED Triage Vitals  Enc Vitals Group     BP 02/26/17 1459 110/73     Pulse Rate 02/26/17 1459 84     Resp 02/26/17 1459 18     Temp 02/26/17 1459 98.4 F (36.9 C)     Temp Source 02/26/17 1459 Oral     SpO2 02/26/17 1459 99 %     Weight 02/26/17 1457 165 lb (74.8 kg)     Height 02/26/17 1457 5\' 5"  (1.651 m)     Head Circumference --      Peak Flow --      Pain Score 02/26/17 1457 4     Pain Loc --      Pain Edu? --      Excl. in GC? --    No data found.  Updated Vital Signs BP 110/73 (BP Location: Left Arm)   Pulse 84   Temp 98.4 F (36.9 C) (Oral)   Resp 18   Ht 5\' 5"  (1.651 m)   Wt 165 lb (74.8 kg)   LMP 02/19/2017   SpO2 99%   BMI 27.46 kg/m   Visual Acuity Right Eye Distance:   Left Eye Distance:   Bilateral Distance:    Right Eye Near:   Left Eye Near:    Bilateral Near:     Physical Exam  Constitutional: She is oriented to person, place, and time. She appears well-developed and well-nourished. No distress.  Patient is sitting on the exam table in no acute distress.   HENT:  Head: Normocephalic and atraumatic.  Right Ear: Hearing, tympanic membrane, external ear and ear canal normal.  Left Ear: Hearing, tympanic membrane, external ear and ear canal normal.  Nose: Nose normal. Right sinus exhibits no maxillary sinus tenderness and no frontal sinus tenderness. Left sinus exhibits no maxillary sinus tenderness and no frontal sinus tenderness.  Mouth/Throat: Uvula is midline, oropharynx is clear and moist and mucous membranes are normal.  Eyes: Conjunctivae, EOM and lids  are normal. Pupils are equal, round, and reactive to light.  Neck: Normal range of motion. Neck supple. Muscular tenderness present. No neck rigidity. Normal range of motion present.    Slightly tender along posterior occipital muscle area and trapezius muscles bilaterally. No swelling, redness seen. Has full range of motion of neck.   Cardiovascular: Normal rate, regular rhythm and normal heart sounds.  No murmur heard. Pulmonary/Chest: Effort normal and breath sounds normal. No respiratory distress.  Musculoskeletal: Normal range of motion.  Lymphadenopathy:    She has no cervical adenopathy.  Neurological: She  is alert and oriented to person, place, and time. She has normal strength and normal reflexes. She displays normal reflexes. No cranial nerve deficit or sensory deficit. She exhibits normal muscle tone. She displays a negative Romberg sign. Coordination and gait normal.  Skin: Skin is warm and dry. Capillary refill takes less than 2 seconds.  Psychiatric: She has a normal mood and affect. Her behavior is normal. Judgment and thought content normal.     UC Treatments / Results  Labs (all labs ordered are listed, but only abnormal results are displayed) Labs Reviewed - No data to display  EKG  EKG Interpretation None       Radiology No results found.  Procedures Procedures (including critical care time)  Medications Ordered in UC Medications  ketorolac (TORADOL) injection 60 mg (60 mg Intramuscular Given 02/26/17 1532)     Initial Impression / Assessment and Plan / UC Course  I have reviewed the triage vital signs and the nursing notes.  Pertinent labs & imaging results that were available during my care of the patient were reviewed by me and considered in my medical decision making (see chart for details).    Discussed with patient that the combination of Lexapro and Flagyl as well as the change in barometric pressure may have triggered a migraine-like headache.  Recommend Toradol 60mg  IM now. Since she has only taken 1 dose of Lexapro, recommend hold off on this medication for another 5 to 6 days and then may restart medication. Stop oral Flagyl. May trial vaginal Metronidazole gel- one applicatorful at bedtime for the next 5 nights. Continue to increase fluids and rest. May take Tylenol 1000mg  every 8 hours if headache persists. Recommend follow-up with your PCP in 3 days if minimal improvement or go to the ER if symptoms persist or worsen.   Final Clinical Impressions(s) / UC Diagnoses   Final diagnoses:  Headache disorder  Bacterial vaginitis    ED Discharge Orders        Ordered    metroNIDAZOLE (METROGEL) 0.75 % vaginal gel  Daily at bedtime     02/26/17 1531       Controlled Substance Prescriptions [place] Controlled Substance Registry consulted? Not Applicable   Sudie Grumblingmyot, Eldred Lievanos Berry, NP 02/26/17 [phone]

## 2017-02-26 NOTE — Discharge Instructions (Signed)
You were given a shot of Toradol 60mg  today to help with pain. Since just restarted Lexapro yesterday, may stop for now- may restart in 5 to 7 days. Stop oral Flagyl- switch to vaginal gel one applicatorful at bedtime for next 5 nights. Continue to increase fluids and rest. May take Tylenol 1000mg  in 6 hours if needed. Recommend follow-up with your PCP in 3 days if not improving or go to ER if symptoms persist or worsen.

## 2017-03-22 ENCOUNTER — Ambulatory Visit (INDEPENDENT_AMBULATORY_CARE_PROVIDER_SITE_OTHER): Payer: 59 | Admitting: Obstetrics and Gynecology

## 2017-03-22 ENCOUNTER — Encounter: Payer: Self-pay | Admitting: Obstetrics and Gynecology

## 2017-03-22 DIAGNOSIS — R87610 Atypical squamous cells of undetermined significance on cytologic smear of cervix (ASC-US): Secondary | ICD-10-CM | POA: Diagnosis not present

## 2017-03-22 DIAGNOSIS — Z113 Encounter for screening for infections with a predominantly sexual mode of transmission: Secondary | ICD-10-CM | POA: Diagnosis not present

## 2017-03-22 DIAGNOSIS — Z124 Encounter for screening for malignant neoplasm of cervix: Secondary | ICD-10-CM | POA: Diagnosis not present

## 2017-03-22 DIAGNOSIS — Z01419 Encounter for gynecological examination (general) (routine) without abnormal findings: Secondary | ICD-10-CM

## 2017-03-22 NOTE — Patient Instructions (Signed)
Probiotic: Hylafem.  Nuswab PCR test for bacterial vaginosis  Bacterial Vaginosis Bacterial vaginosis is a vaginal infection that occurs when the normal balance of bacteria in the vagina is disrupted. It results from an overgrowth of certain bacteria. This is the most common vaginal infection among women ages 4015-44. Because bacterial vaginosis increases your risk for STIs (sexually transmitted infections), getting treated can help reduce your risk for chlamydia, gonorrhea, herpes, and HIV (human immunodeficiency virus). Treatment is also important for preventing complications in pregnant women, because this condition can cause an early (premature) delivery. What are the causes? This condition is caused by an increase in harmful bacteria that are normally present in small amounts in the vagina. However, the reason that the condition develops is not fully understood. What increases the risk? The following factors may make you more likely to develop this condition:  Having a new sexual partner or multiple sexual partners.  Having unprotected sex.  Douching.  Having an intrauterine device (IUD).  Smoking.  Drug and alcohol abuse.  Taking certain antibiotic medicines.  Being pregnant.  You cannot get bacterial vaginosis from toilet seats, bedding, swimming pools, or contact with objects around you. What are the signs or symptoms? Symptoms of this condition include:  Grey or white vaginal discharge. The discharge can also be watery or foamy.  A fish-like odor with discharge, especially after sexual intercourse or during menstruation.  Itching in and around the vagina.  Burning or pain with urination.  Some women with bacterial vaginosis have no signs or symptoms. How is this diagnosed? This condition is diagnosed based on:  Your medical history.  A physical exam of the vagina.  Testing a sample of vaginal fluid under a microscope to look for a large amount of bad bacteria or  abnormal cells. Your health care provider may use a cotton swab or a small wooden spatula to collect the sample.  How is this treated? This condition is treated with antibiotics. These may be given as a pill, a vaginal cream, or a medicine that is put into the vagina (suppository). If the condition comes back after treatment, a second round of antibiotics may be needed. Follow these instructions at home: Medicines  Take over-the-counter and prescription medicines only as told by your health care provider.  Take or use your antibiotic as told by your health care provider. Do not stop taking or using the antibiotic even if you start to feel better. General instructions  If you have a female sexual partner, tell her that you have a vaginal infection. She should see her health care provider and be treated if she has symptoms. If you have a female sexual partner, he does not need treatment.  During treatment: ? Avoid sexual activity until you finish treatment. ? Do not douche. ? Avoid alcohol as directed by your health care provider. ? Avoid breastfeeding as directed by your health care provider.  Drink enough water and fluids to keep your urine clear or pale yellow.  Keep the area around your vagina and rectum clean. ? Wash the area daily with warm water. ? Wipe yourself from front to back after using the toilet.  Keep all follow-up visits as told by your health care provider. This is important. How is this prevented?  Do not douche.  Wash the outside of your vagina with warm water only.  Use protection when having sex. This includes latex condoms and dental dams.  Limit how many sexual partners you have. To help prevent  bacterial vaginosis, it is best to have sex with just one partner (monogamous).  Make sure you and your sexual partner are tested for STIs.  Wear cotton or cotton-lined underwear.  Avoid wearing tight pants and pantyhose, especially during summer.  Limit the  amount of alcohol that you drink.  Do not use any products that contain nicotine or tobacco, such as cigarettes and e-cigarettes. If you need help quitting, ask your health care provider.  Do not use illegal drugs. Where to find more information:  Centers for Disease Control and Prevention: SolutionApps.co.za  American Sexual Health Association (ASHA): www.ashastd.org  U.S. Department of Health and Health and safety inspector, Office on Women's Health: ConventionalMedicines.si or http://www.anderson-williamson.info/ Contact a health care provider if:  Your symptoms do not improve, even after treatment.  You have more discharge or pain when urinating.  You have a fever.  You have pain in your abdomen.  You have pain during sex.  You have vaginal bleeding between periods. Summary  Bacterial vaginosis is a vaginal infection that occurs when the normal balance of bacteria in the vagina is disrupted.  Because bacterial vaginosis increases your risk for STIs (sexually transmitted infections), getting treated can help reduce your risk for chlamydia, gonorrhea, herpes, and HIV (human immunodeficiency virus). Treatment is also important for preventing complications in pregnant women, because the condition can cause an early (premature) delivery.  This condition is treated with antibiotic medicines. These may be given as a pill, a vaginal cream, or a medicine that is put into the vagina (suppository). This information is not intended to replace advice given to you by your health care provider. Make sure you discuss any questions you have with your health care provider. Document Released: 02/23/2005 Document Revised: 06/29/2016 Document Reviewed: 11/09/2015 Elsevier Interactive Patient Education  [employer].

## 2017-03-22 NOTE — Progress Notes (Signed)
Patient ID: Tracey Robinson, female   DOB: 1990-11-30, 27 y.o.   MRN: [id number]     Gynecology Annual Exam  PCP: Malva Limes, MD  Chief Complaint:  Chief Complaint  Patient presents with  . Gynecologic Exam    Reoccurring BV within last year    History of Present Illness: Patient is a 27 y.o. G0P0000 presents for annual exam. The patient has no complaints today.   LMP: Patient's last menstrual period was 03/17/2017. Average Interval: regular, 28 days Duration of flow: 5 days Heavy Menses: no Clots: no Intermenstrual Bleeding: no Postcoital Bleeding: no Dysmenorrhea: no  The patient is sexually active. She currently uses condoms for contraception. She denies dyspareunia.  The patient does perform self breast exams.  There is no notable family history of breast or ovarian cancer in her family.  The patient wears seatbelts: yes.  The patient has regular exercise: not asked.    The patient repots current symptoms of depression, managed by PCP.    She does have a history of recurrent BV, no current symptoms.  Main symptom she reports at the time of episodes is vaginal and vulvar irritation not discharge and/or odor.  Symptoms have always resolved following metronidazole course.  She has switched to non-latex condoms with her boyfriend and seems to have noted improvement with this changes.  Already using non-scented soaps, pH neutral.  Review of Systems: Review of Systems  Constitutional: Negative for chills and fever.  HENT: Negative for congestion.   Respiratory: Negative for cough and shortness of breath.   Cardiovascular: Negative for chest pain and palpitations.  Gastrointestinal: Negative for abdominal pain, constipation, diarrhea, heartburn, nausea and vomiting.  Genitourinary: Negative for dysuria, frequency and urgency.  Skin: Negative for itching and rash.  Neurological: Negative for dizziness and headaches.  Endo/Heme/Allergies: Negative for polydipsia.    Psychiatric/Behavioral: Negative for depression.    Past Medical History:  Past Medical History:  Diagnosis Date  . Anxiety   . Panic attack     Past Surgical History:  Past Surgical History:  Procedure Laterality Date  . NO PAST SURGERIES      Gynecologic History:  Patient's last menstrual period was 03/17/2017. Contraception: condoms Last Pap: Results were: 1 year ago normal   Obstetric History: G0P0000  Family History:  Family History  Problem Relation Age of Onset  . Hyperlipidemia Mother   . Ulcers Father   . Heart attack Father   . Anxiety disorder Father   . Heart attack Paternal Grandfather   . Diabetes Paternal Grandfather   . Anxiety disorder Paternal Grandmother     Social History:  Social History   Socioeconomic History  . Marital status: Single    Spouse name: Not on file  . Number of children: 0  . Years of education: Not on file  . Highest education level: Not on file  Social Needs  . Financial resource strain: Not on file  . Food insecurity - worry: Not on file  . Food insecurity - inability: Not on file  . Transportation needs - medical: Not on file  . Transportation needs - non-medical: Not on file  Occupational History  . 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 Smoking status: Never Smoker  . Smokeless tobacco: Never Used  Substance and Sexual Activity  . Alcohol use: Yes    Alcohol/week: 0.0 oz    Comment: occasional glass of wine  . Drug use: No  .  Sexual activity: Yes    Birth control/protection: None  Other Topics Concern  . Not on file  Social History Narrative  . Not on file    Allergies:  Allergies  Allergen Reactions  . Penicillins Rash    Medications: Prior to Admission medications   Medication Sig Start Date End Date Taking? Authorizing Provider  ALPRAZolam (XANAX) 0.25 MG tablet Take 1 tablet (0.25 mg total) by mouth every 6 (six) hours as needed. 02/25/17   Malva LimesFisher,  Donald E, MD  escitalopram (LEXAPRO) 10 MG tablet 1/2 tablet daily for 6 days, then increase to 1 tablet daily 02/25/17   Malva LimesFisher, Donald E, MD  fluticasone [facility](FLONASE) 50 MCG/ACT nasal spray Place into the nose.    [provider]    Physical Exam Vitals: Blood pressure 112/72, pulse 92, height 5\' 5"  (1.651 m), weight 164 lb (74.4 kg), last menstrual period 03/17/2017.  General: NAD HEENT: normocephalic, anicteric Thyroid: no enlargement, no palpable nodules Pulmonary: No increased work of breathing, CTAB Cardiovascular: RRR, distal pulses 2+ Breast: Breast symmetrical, no tenderness, no palpable nodules or masses, no skin or nipple retraction present, no nipple discharge.  No axillary or supraclavicular lymphadenopathy. Abdomen: NABS, soft, non-tender, non-distended.  Umbilicus without lesions.  No hepatomegaly, splenomegaly or masses palpable. No evidence of hernia  Genitourinary:  External: Normal external female genitalia.  Normal urethral meatus, normal  Bartholin's and Skene's glands.    Vagina: Normal vaginal mucosa, no evidence of prolapse.    Cervix: Grossly normal in appearance, no bleeding  Uterus: Non-enlarged, mobile, normal contour.  No CMT  Adnexa: ovaries non-enlarged, no adnexal masses  Rectal: deferred  Lymphatic: no evidence of inguinal lymphadenopathy Extremities: no edema, erythema, or tenderness Neurologic: Grossly intact Psychiatric: mood appropriate, affect full  Female chaperone present for pelvic and breast  portions of the physical exam    Assessment: 27 y.o. G0P0000 routine annual exam  Plan: Problem List Items Addressed This Visit    None    Visit Diagnoses    Screening for malignant neoplasm of cervix       Relevant Orders   PapIG, CtNgTv, HPV, rfx 16/18   Encounter for gynecological examination without abnormal finding       Relevant Orders   PapIG, CtNgTv, HPV, rfx 16/18   HEP, RPR, HIV Panel   Hepatitis C antibody   Screen for STD  (sexually transmitted disease)       Relevant Orders   PapIG, CtNgTv, HPV, rfx 16/18   HEP, RPR, HIV Panel   Hepatitis C antibody      1) 4) Gardasil Series discussed and if applicable offered to patient - Patient has previously completed 3 shot series   2) STI screening was offered and accepted - HepC ordered as well works at [employer] [facility] so job exposures  3) ASCCP guidelines and rational discussed.  Patient opts for every 3 years screening interval  4) Contraception - happy with barrier contraception  5) Recurrent BV - we discussed risk factors, and normal vaginal flora.  If symptoms return discussed obtaining NuSwab.  Given discharge and odor are not her main complaint I raised possibility of other etiologies as well.  If recurrent BV confirmed we talked about role of vaginal probiotics in restoring normal vaginal flora.  6) Follow up 1 year for routine annual exam

## 2017-03-23 LAB — HEP, RPR, HIV PANEL
HEP B S AG: NEGATIVE
HIV Screen 4th Generation wRfx: NONREACTIVE
RPR: NONREACTIVE

## 2017-03-23 LAB — HEPATITIS C ANTIBODY: Hep C Virus Ab: <0.1 s/co ratio (ref 0.0–0.9)

## 2017-03-24 LAB — PAPIG, CTNGTV, HPV, RFX 16/18
Chlamydia, Nuc. Acid Amp: NEGATIVE
GONOCOCCUS, NUC. ACID AMP: NEGATIVE
HPV, high-risk: POSITIVE — AB
PAP Smear Comment: 0
TRICH VAG BY NAA: NEGATIVE

## 2017-03-26 ENCOUNTER — Encounter: Payer: Self-pay | Admitting: Obstetrics and Gynecology

## 2017-03-26 ENCOUNTER — Telehealth: Payer: Self-pay | Admitting: Obstetrics and Gynecology

## 2017-03-26 NOTE — Telephone Encounter (Signed)
-----   Message from Vena AustriaAndreas Staebler, MD sent at 03/26/2017 12:26 PM EST ----- Regarding: Colposcopy Needs colposcopy in the next 2-6 weeks

## 2017-03-26 NOTE — Telephone Encounter (Signed)
Lvm for patient to call back to be schedule °

## 2017-04-12 ENCOUNTER — Other Ambulatory Visit: Payer: Self-pay | Admitting: Family Medicine

## 2017-04-12 DIAGNOSIS — F418 Other specified anxiety disorders: Secondary | ICD-10-CM

## 2017-04-12 MED ORDER — ESCITALOPRAM OXALATE 10 MG PO TABS: ORAL_TABLET | ORAL | 2 refills | Status: DC

## 2017-04-12 MED ORDER — ALPRAZOLAM 0.25 MG PO TABS: 0.2500 mg | ORAL_TABLET | Freq: Four times a day (QID) | ORAL | 1 refills | Status: DC | PRN

## 2017-04-12 NOTE — Telephone Encounter (Signed)
Patient said ins. will not cover her meds [facility]. Xanax .25 mg  and Lexapro 10 mg.  needs to be [facility] to [facility].  She is out of the meds [facility] but our ins. Wont cover them there.

## 2017-04-12 NOTE — Telephone Encounter (Signed)
Ok to send Rx into [facility] pharmacy? Please advise. Thanks!

## 2017-04-27 ENCOUNTER — Encounter: Payer: Self-pay | Admitting: Obstetrics and Gynecology

## 2017-04-27 ENCOUNTER — Ambulatory Visit (INDEPENDENT_AMBULATORY_CARE_PROVIDER_SITE_OTHER): Payer: 59 | Admitting: Obstetrics and Gynecology

## 2017-04-27 VITALS — BP 110/66 | HR 108 | Ht 65.0 in | Wt 161.0 lb

## 2017-04-27 DIAGNOSIS — R87610 Atypical squamous cells of undetermined significance on cytologic smear of cervix (ASC-US): Secondary | ICD-10-CM | POA: Diagnosis not present

## 2017-04-27 DIAGNOSIS — R87619 Unspecified abnormal cytological findings in specimens from cervix uteri: Secondary | ICD-10-CM | POA: Diagnosis not present

## 2017-04-27 DIAGNOSIS — R87810 Cervical high risk human papillomavirus (HPV) DNA test positive: Secondary | ICD-10-CM

## 2017-04-27 NOTE — Progress Notes (Signed)
   GYNECOLOGY CLINIC COLPOSCOPY PROCEDURE NOTE  27 y.o. G0P0000 here for colposcopy for ASCUS with POSITIVE high risk HPV pap smear on 03/22/2017. Discussed underlying role for HPV infection in the development of cervical dysplasia, its natural history and progression/regression, need for surveillance.  Is the patient  pregnant: No LMP: Patient's last menstrual period was 04/21/2017 (exact date). Smoking status:  reports that  has never smoked. she has never used smokeless tobacco. Contraception: condoms History of STD:  No Future fertility desired:  Yes  Patient given informed consent, signed copy in the chart, time out was performed.  The patient was position in dorsal lithotomy position. Speculum was placed the cervix was visualized.   After application of acetic acid colposcopic inspection of the cervix was undertaken.   Colposcopy adequate, full visualization of transformation zone: Yes no visible lesions; random 12 O'Clock biopsies obtained.   ECC specimen obtained:  Yes  All specimens were labeled and sent to pathology.   Patient was given post procedure instructions.  Will follow up pathology and manage accordingly.  Routine preventative health maintenance measures emphasized.  OBGyn Exam  Tracey AustriaAndreas Saranne Crislip, MD, Tracey FrederickFACOG [facility], [facility]

## 2017-04-27 NOTE — Addendum Note (Signed)
Addended by: Lorrene ReidSTAEBLER, Eunie Lawn M on: 04/27/2017 02:33 PM   Modules accepted: Orders

## 2017-04-29 ENCOUNTER — Encounter: Payer: Self-pay | Admitting: Obstetrics and Gynecology

## 2017-04-29 LAB — PATHOLOGY

## 2017-11-01 ENCOUNTER — Telehealth: Payer: Self-pay | Admitting: Obstetrics and Gynecology

## 2017-11-01 NOTE — Telephone Encounter (Signed)
no

## 2017-11-01 NOTE — Telephone Encounter (Signed)
Patient is calling wanting to have IUD birthcontrol. Patient can't remember which IUD she needs. Could you please advise so I may get her schedule.

## 2017-11-01 NOTE — Telephone Encounter (Signed)
Patient will call back to be schedule. Does patient have a prescription to take before this appt?

## 2017-11-01 NOTE — Telephone Encounter (Signed)
Mirena IUD

## 2017-11-19 ENCOUNTER — Other Ambulatory Visit: Payer: Self-pay | Admitting: Family Medicine

## 2017-11-19 DIAGNOSIS — F418 Other specified anxiety disorders: Secondary | ICD-10-CM

## 2017-12-31 ENCOUNTER — Telehealth: Payer: Self-pay

## 2017-12-31 NOTE — Telephone Encounter (Signed)
Pt called 14m ago for rx for IUD to be sent to [facility] pharm.  Pharm does not have rx .  Has to get IUD thru [facility].  [phone]

## 2017-12-31 NOTE — Telephone Encounter (Signed)
I spoke to patient. She is needing the medication that helps dilate the cervix for easier IUD placement. Also, at what point does she come in once she has used that medication? [facility] pharmacy please

## 2018-01-04 ENCOUNTER — Other Ambulatory Visit: Payer: Self-pay | Admitting: Obstetrics and Gynecology

## 2018-01-04 MED ORDER — MISOPROSTOL 200 MCG PO TABS: 200.0000 ug | ORAL_TABLET | Freq: Once | ORAL | 0 refills | Status: DC

## 2018-01-04 NOTE — Telephone Encounter (Signed)
Cytotec rx is in

## 2018-02-01 ENCOUNTER — Encounter: Payer: Self-pay | Admitting: Physician Assistant

## 2018-02-01 ENCOUNTER — Ambulatory Visit: Payer: Self-pay | Admitting: Physician Assistant

## 2018-02-01 VITALS — BP 102/70 | HR 84 | Temp 98.5°F | Wt 167.0 lb

## 2018-02-01 DIAGNOSIS — N3001 Acute cystitis with hematuria: Secondary | ICD-10-CM

## 2018-02-01 DIAGNOSIS — R300 Dysuria: Secondary | ICD-10-CM

## 2018-02-01 LAB — POCT URINE PREGNANCY: PREG TEST UR: NEGATIVE

## 2018-02-01 LAB — POCT URINALYSIS DIPSTICK
Bilirubin, UA: NEGATIVE
Blood, UA: POSITIVE
GLUCOSE UA: NEGATIVE
KETONES UA: NEGATIVE
Leukocytes, UA: NEGATIVE
Nitrite, UA: NEGATIVE
Protein, UA: NEGATIVE
Urobilinogen, UA: 0.2 E.U./dL
pH, UA: 5 (ref 5.0–8.0)

## 2018-02-01 MED ORDER — NITROFURANTOIN MONOHYD MACRO 100 MG PO CAPS: 100.0000 mg | ORAL_CAPSULE | Freq: Two times a day (BID) | ORAL | 0 refills | Status: AC

## 2018-02-01 MED ORDER — PHENAZOPYRIDINE HCL 200 MG PO TABS: 200.0000 mg | ORAL_TABLET | Freq: Three times a day (TID) | ORAL | 0 refills | Status: DC | PRN

## 2018-02-01 NOTE — Patient Instructions (Signed)
Thank you for choosing [facility] for your health care needs.  You have been diagnosed with a urinary tract infection.  Take antibiotic, Macrobid, as prescribed. 1 pill twice a day x 5 days. You have also been prescribed Pyridium. A medication that will help with your symptoms. Will turn your urine orange in color.  Increase fluids.  Empty bladder frequently.  Follow-up at family physician's office, Ob/Gyn office, or urgent care in 2-3 days if symptoms not resolved. At that time, should have a repeat UA and a urine culture performed.  Hope you feel better soon.  Urinary Tract Infection, Adult A urinary tract infection (UTI) is an infection of any part of the urinary tract. The urinary tract includes the:  Kidneys.  Ureters.  Bladder.  Urethra.  These organs make, store, and get rid of pee (urine) in the body. Follow these instructions at home:  Take over-the-counter and prescription medicines only as told by your doctor.  If you were prescribed an antibiotic medicine, take it as told by your doctor. Do not stop taking the antibiotic even if you start to feel better.  Avoid the following drinks: ? Alcohol. ? Caffeine. ? Tea. ? Carbonated drinks.  Drink enough fluid to keep your pee clear or pale yellow.  Keep all follow-up visits as told by your doctor. This is important.  Make sure to: ? Empty your bladder often and completely. Do not to hold pee for long periods of time. ? Empty your bladder before and after sex. ? Wipe from front to back after a bowel movement if you are female. Use each tissue one time when you wipe. Contact a doctor if:  You have back pain.  You have a fever.  You feel sick to your stomach (nauseous).  You throw up (vomit).  Your symptoms do not get better after 3 days.  Your symptoms go away and then come back. Get help right away if:  You have very bad back pain.  You have very bad lower belly (abdominal) pain.  You are throwing  up and cannot keep down any medicines or water. This information is not intended to replace advice given to you by your health care provider. Make sure you discuss any questions you have with your health care provider. Document Released: 08/12/2007 Document Revised: 08/01/2015 Document Reviewed: 01/14/2015 Elsevier Interactive Patient Education  [employer]2018 Elsevier Inc.

## 2018-02-01 NOTE — Progress Notes (Signed)
Patient ID: Tracey Robinson DOB: 1990-04-20 AGE: 27 y.o. MRN: [id number]   PCP: Malva Limes, MD   Chief Complaint:  Chief Complaint  Patient presents with  . save-dysuria x 1d     Subjective:    HPI:  Tracey Robinson is a 27 y.o. female presents for evaluation  Chief Complaint  Patient presents with  . save-dysuria x 33d   27 year old female presents to [facility] with 2-day history of UTI symptoms.  Began yesterday midday.  Did not wake up with symptoms.  Reports increased urinary frequency.  Patient also reports urinary urgency, sensation of incomplete voiding, and foul-odor to urine.  Symptoms gradually worsening.  Today worse than yesterday.  Patient states yesterday while at work she held her urine due to being busy.  Has not taken any OTC medication for symptom relief. No previous history of UTI.  Denies fever, chills, flank pain, back ache, abdominal pain, nausea/vomiting, vaginal rash/discharge/pruritis, dysuria, gross hematuria, urinary incontinence.  Patient with no history of STD (other than HPV positive pap smear). Patient denies concern for STD; same partner for 6 months. Uses condoms. Patient states her and her partner were tested for STDs prior to having a sexual relationship; both negative.  Previous history of recurrent yeast infection and BV. Last episode 08/25/2017. Resolved with Diflucan (monthly for 6 months), Metronidazole, and lower dose estrogen OCP.   Patient last seen by Ob/Gyn, Dr. Vena Austria MD with [facility], on 2/29/2019. Under colposcopy for ASCUS with positive high risk HPV pap smear on 03/22/2017. Results were normal. Advised annual follow-up.  A complete, at least 10 system review of symptoms was performed, pertinent positives and negatives as mentioned in HPI, otherwise negative.  The following portions of the patient's history were reviewed and updated as appropriate: allergies, current medications and past medical  history.  Patient Active Problem List   Diagnosis Date Noted  . Situational anxiety 02/25/2017  . Vaginitis 02/25/2017  . Fast heart beat 11/06/2014  . Panic attack 11/02/2014  . Anxiety 11/02/2014  . Reflux 03/28/2013  . Benign cyst of breast 03/28/2013    Allergies  Allergen Reactions  . Penicillins Rash    Current Outpatient Medications on File Prior to Visit  Medication Sig Dispense Refill  . ALPRAZolam (XANAX) 0.25 MG tablet TAKE 1 TABLET (0.25 MG TOTAL) BY MOUTH EVERY 6 (SIX) HOURS AS NEEDED. 30 tablet 1   No current facility-administered medications on file prior to visit.        Objective:   Vitals:   02/01/18 1212  BP: 102/70  Pulse: 84  Temp: 98.5 F (36.9 C)  SpO2: 98%     Wt Readings from Last 3 Encounters:  02/01/18 167 lb (75.8 kg)  04/27/17 161 lb (73 kg)  03/22/17 164 lb (74.4 kg)    Physical Exam:   General Appearance:  Alert, cooperative, appears stated age. In no acute distress. Afebrile.  Head:  Normocephalic, without obvious abnormality, atraumatic  Eyes:  PERRL, conjunctiva/corneas clear, EOM's intact  Back:   Symmetric, no curvature, ROM normal, no CVA tenderness with percussion bilaterally.  Lungs:   Clear to auscultation bilaterally, respirations unlabored  Heart:  Regular rate and rhythm, S1 and S2 normal, no murmur, rub, or gallop  Abdomen:   Soft, bowel sounds active all four quadrants, no masses, no organomegaly. Mild suprapubic tenderness with palpation. No guarding, rigidity, or rebound tenderness.  Extremities: Extremities normal, atraumatic, no cyanosis or edema  Pulses: 2+ and symmetric  Skin: Skin color, texture, turgor normal, no rashes or lesions  Lymph nodes: Cervical, supraclavicular, and axillary nodes normal  Neurologic: Normal    Assessment & Plan:    Exam findings, diagnosis etiology and medication use and indications reviewed with patient. Follow-Up and discharge instructions provided. No emergent/urgent issues  found on exam.  Patient education was provided.   Patient verbalized understanding of information provided and agrees with plan of care (POC), all questions answered. The patient is advised to call or return to clinic if condition does not see an improvement in symptoms, or to seek the care of the closest emergency department if condition worsens with the below plan.   Orders Placed This Encounter  Procedures  . POCT Urinalysis Dipstick  . POCT urine pregnancy    1. Dysuria  - POCT Urinalysis Dipstick - POCT Urine Pregnancy  UA reveals small blood, neg nitrate, neg glucose, and neg leukocytes.  Patient with two day history of UTI symptoms; increased urinary frequency, urinary urgency (including one episode of nocturia, not per patient's typical), and foul-odor to urine. Benign UA (S.G. of <1.005). Will treat empirically for UTI. Prescribed 5-day course of Macrobid; also prescribed Pyridium for symptom relief. Advised increase in fluids and frequent emptying of bladder.   Instructed patient to follow-up with PCP or Ob/Gyn or urgent care for further evaluation if symptoms do not resolve with Macrobid within 2-3 days.   Tracey Robinson, MHS, PA-C Tracey Robinson, MHS, PA-C Advanced Practice Provider [facility]  [facility]  [address], [facility], [address]st Floor [place], [place] [address] (p):  [phone] [email or web address]

## 2018-02-07 ENCOUNTER — Telehealth: Payer: Self-pay | Admitting: Emergency Medicine

## 2018-02-07 NOTE — Telephone Encounter (Signed)
Left message follow up call from [facility] visit 

## 2018-02-09 ENCOUNTER — Ambulatory Visit (INDEPENDENT_AMBULATORY_CARE_PROVIDER_SITE_OTHER): Payer: 59 | Admitting: Family Medicine

## 2018-02-09 ENCOUNTER — Encounter: Payer: Self-pay | Admitting: Family Medicine

## 2018-02-09 DIAGNOSIS — F418 Other specified anxiety disorders: Secondary | ICD-10-CM | POA: Diagnosis not present

## 2018-02-09 MED ORDER — ESCITALOPRAM OXALATE 10 MG PO TABS: ORAL_TABLET | ORAL | 3 refills | Status: DC

## 2018-02-09 NOTE — Progress Notes (Signed)
Patient: Tracey Robinson Female    DOB: 09/06/1990   27 y.o.   MRN: [id number] Visit Date: 02/09/2018  Today's Provider: Mila Merryonald Edan Juday, MD   Chief Complaint  Patient presents with  . Anxiety   Subjective:    Anxiety  Presents for initial visit. Episode onset: Pt states she as always struggled with anxiety in the past, but has worsening in the last year. The problem has been gradually worsening (Especially since October. ). Symptoms include decreased concentration, excessive worry, insomnia (Pt reports having trouble with falling asleep and also wakes up often. ), nervous/anxious behavior, panic and restlessness. Patient reports no confusion, depressed mood, dizziness, nausea, obsessions or suicidal ideas. The symptoms are aggravated by work stress and family issues. The quality of sleep is poor. Nighttime awakenings: several.   Her past medical history is significant for anxiety/panic attacks. Past treatments include benzodiazephines.  She also took escitalopram for a few months due to anxiety after starting new job last year and reports she did well with it. She feels like she needs to talk to a counselor about all the personal and work related stress she is experiencing. She feels zones out and can't focus, is easily distracted and sleeping poorly. Not particularly depressed, but very anxious.      Allergies  Allergen Reactions  . Penicillins Rash     Current Outpatient Medications:  .  ALPRAZolam (XANAX) 0.25 MG tablet, TAKE 1 TABLET (0.25 MG TOTAL) BY MOUTH EVERY 6 (SIX) HOURS AS NEEDED., Disp: 30 tablet, Rfl: 1 .  Melatonin 10 MG TABS, Take by mouth., Disp: , Rfl:  .  phenazopyridine (PYRIDIUM) 200 MG tablet, Take 1 tablet (200 mg total) by mouth 3 (three) times daily as needed for pain. (Patient not taking: Reported on 02/09/2018), Disp: 10 tablet, Rfl: 0  Review of Systems  Constitutional: Positive for fatigue. Negative for activity change, appetite change, chills,  diaphoresis, fever and unexpected weight change.  Respiratory: Negative.   Cardiovascular: Negative.   Gastrointestinal: Positive for diarrhea. Negative for abdominal distention, abdominal pain, anal bleeding, blood in stool, constipation, nausea, rectal pain and vomiting.  Endocrine: Negative for cold intolerance and heat intolerance.  Neurological: Negative for dizziness, light-headedness and headaches.  Psychiatric/Behavioral: Positive for decreased concentration and sleep disturbance. Negative for agitation, behavioral problems, confusion, dysphoric mood, hallucinations, self-injury and suicidal ideas. The patient is nervous/anxious and has insomnia (Pt reports having trouble with falling asleep and also wakes up often. ). The patient is not hyperactive.     Social History   Tobacco Use  . Smoking status: Never Smoker  . Smokeless tobacco: Never Used  Substance Use Topics  . Alcohol use: Yes    Alcohol/week: 0.0 standard drinks    Comment: occasional glass of wine   Objective:   BP 116/78 (BP Location: Right Arm, Patient Position: Sitting, Cuff Size: Normal)   Pulse 92   Temp 98.7 F (37.1 C) (Oral)   Resp 16   Wt 165 lb (74.8 kg)   LMP 01/14/2018   BMI 27.46 kg/m  Vitals:   02/09/18 1458  BP: 116/78  Pulse: 92  Resp: 16  Temp: 98.7 F (37.1 C)  TempSrc: Oral  Weight: 165 lb (74.8 kg)     Physical Exam   General Appearance:    Alert, cooperative, no distress  Eyes:    PERRL, conjunctiva/corneas clear, EOM's intact       Lungs:     Clear to auscultation bilaterally, respirations unlabored  Heart:    Regular rate and rhythm  Neurologic:   Awake, alert, oriented x 3. No apparent focal neurological           defect.          Assessment & Plan:     1. Situational anxiety  - escitalopram (LEXAPRO) 10 MG tablet; 1/2 tablet daily for 6 days, then increase to 1 tablet daily  Dispense: 30 tablet; Refill: 3 - Ambulatory referral to Psychology  Follow up in a month  for medication management.        Mila Merry, MD  [facility]

## 2018-02-14 ENCOUNTER — Telehealth: Payer: Self-pay | Admitting: Obstetrics and Gynecology

## 2018-02-14 ENCOUNTER — Other Ambulatory Visit: Payer: Self-pay | Admitting: Obstetrics and Gynecology

## 2018-02-14 MED ORDER — MISOPROSTOL 200 MCG PO TABS: 200.0000 ug | ORAL_TABLET | Freq: Once | ORAL | 0 refills | Status: DC

## 2018-02-14 NOTE — Telephone Encounter (Signed)
I've resent the cytotec

## 2018-02-14 NOTE — Telephone Encounter (Signed)
Patient is schedule Friday, 02/18/18 for mirena insertionwith AMS. Patient reports already taking cytotec last evening with the hopes of an appointment this morning because patient started mentrual cycle on Friday. Patient is requesting refill on Cytotec for Friday's appointment. Please advise

## 2018-02-14 NOTE — Telephone Encounter (Signed)
Patient aware.

## 2018-02-16 ENCOUNTER — Telehealth: Payer: Self-pay | Admitting: Family Medicine

## 2018-02-16 DIAGNOSIS — F419 Anxiety disorder, unspecified: Secondary | ICD-10-CM

## 2018-02-16 DIAGNOSIS — F418 Other specified anxiety disorders: Secondary | ICD-10-CM

## 2018-02-16 DIAGNOSIS — F410 Panic disorder [episodic paroxysmal anxiety] without agoraphobia: Secondary | ICD-10-CM

## 2018-02-16 NOTE — Telephone Encounter (Signed)
Pt would like to be referred to Dr Maryruth BunKapur for anxiety instead of a psychologist unless you feel she needs to be referred to both

## 2018-02-16 NOTE — Telephone Encounter (Signed)
Noted  

## 2018-02-17 NOTE — Progress Notes (Signed)
   GYNECOLOGY OFFICE PROCEDURE NOTE  Tracey Robinson is a 27 y.o. G0P0000 here for a Mirena IUD insertion. No GYN concerns.  Last pap smear was on 03/22/2017 and was ASCUS HPV positive with normal colposcopy.  LMP 02/11/2018.Marland Kitchen  The indication for her IUD is contraception.  IUD Insertion Procedure Note Patient identified, informed consent performed, consent signed.   Discussed risks of irregular bleeding, cramping, infection, malpositioning, expulsion or uterine perforation of the IUD (1:1000 placements)  which may require further procedure such as laparoscopy.  IUD while effective at preventing pregnancy do not prevent transmission of sexually transmitted diseases and use of barrier methods for this purpose was discussed. Time out was performed.  Urine pregnancy test negative.  Speculum placed in the vagina.  Cervix visualized.  Cleaned with Betadine x 2.  Grasped anteriorly with a single tooth tenaculum.  Uterus sounded to 5 cm before resistance was met.  Given inability to achieve adequate sounding length procedure was aborted.  We discussed alternative options with patient opting to trial nuvaring.  Rx nuvaring and 3 months samples provided  [name], MD, [facility], [place]

## 2018-02-18 ENCOUNTER — Encounter: Payer: Self-pay | Admitting: Obstetrics and Gynecology

## 2018-02-18 ENCOUNTER — Ambulatory Visit (INDEPENDENT_AMBULATORY_CARE_PROVIDER_SITE_OTHER): Payer: 59 | Admitting: Obstetrics and Gynecology

## 2018-02-18 VITALS — BP 108/78 | HR 87 | Wt 168.0 lb

## 2018-02-18 DIAGNOSIS — Z30430 Encounter for insertion of intrauterine contraceptive device: Secondary | ICD-10-CM | POA: Diagnosis not present

## 2018-02-18 DIAGNOSIS — Z30015 Encounter for initial prescription of vaginal ring hormonal contraceptive: Secondary | ICD-10-CM

## 2018-02-18 MED ORDER — ETONOGESTREL-ETHINYL ESTRADIOL 0.12-0.015 MG/24HR VA RING: VAGINAL_RING | VAGINAL | 11 refills | Status: DC

## 2018-02-18 NOTE — Telephone Encounter (Signed)
Uterus only sounded to 5cm. Unable to place. No device opened/used. Pt received NuvaRing instead

## 2018-03-16 ENCOUNTER — Other Ambulatory Visit: Payer: Self-pay

## 2018-03-16 ENCOUNTER — Ambulatory Visit: Payer: 59 | Admitting: Family Medicine

## 2018-03-16 ENCOUNTER — Encounter: Payer: Self-pay | Admitting: Family Medicine

## 2018-03-16 VITALS — BP 120/84 | HR 95 | Temp 98.3°F | Ht 65.0 in | Wt 165.0 lb

## 2018-03-16 DIAGNOSIS — F418 Other specified anxiety disorders: Secondary | ICD-10-CM

## 2018-03-16 DIAGNOSIS — R6882 Decreased libido: Secondary | ICD-10-CM

## 2018-03-16 MED ORDER — BUPROPION HCL ER (XL) 150 MG PO TB24
150.0000 mg | ORAL_TABLET | Freq: Every day | ORAL | 2 refills | Status: DC
Start: 2018-03-16 — End: 2018-09-09

## 2018-03-16 NOTE — Patient Instructions (Signed)
.   Please bring all of your medications to every appointment so we can make sure that our medication list is the same as yours.   

## 2018-03-16 NOTE — Progress Notes (Signed)
Patient: Tracey Robinson Female    DOB: Aug 01, 1990   27 y.o.   MRN: [id number] Visit Date: 03/16/2018  Today's Provider: Mila Merry, MD   No chief complaint on file.  Subjective:     HPI   Follow up for situational anxiety  The patient was last seen for this 1 months ago. Changes made at last visit include starting pt on lexapro.  She reports good compliance with treatment. She feels that condition is Improved. She is having side effects. Decreased libido She states if she missesa day of the escitalopram she will very anxious the next day and start ruminating about events that happened months or years ago.  She also states she started using Nuvaring about a month ago and thinks maybe drop in libido is related to that.  ------------------------------------------------------------------------------------      Allergies  Allergen Reactions  . Penicillins Rash     Current Outpatient Medications:  .  ALPRAZolam (XANAX) 0.25 MG tablet, TAKE 1 TABLET (0.25 MG TOTAL) BY MOUTH EVERY 6 (SIX) HOURS AS NEEDED., Disp: 30 tablet, Rfl: 1 .  escitalopram (LEXAPRO) 10 MG tablet, 1/2 tablet daily for 6 days, then increase to 1 tablet daily, Disp: 30 tablet, Rfl: 3 .  etonogestrel-ethinyl estradiol (NUVARING) 0.12-0.015 MG/24HR vaginal ring, Insert vaginally and leave in place for 3 consecutive weeks, then remove for 1 week., Disp: 1 each, Rfl: 11 .  Melatonin 10 MG TABS, Take by mouth., Disp: , Rfl:  .  misoprostol (CYTOTEC) 200 MCG tablet, Place 1 tablet (200 mcg total) vaginally once for 1 dose. At bedtime evening prior to procedure, Disp: 1 tablet, Rfl: 0  Review of Systems  Constitutional: Negative for appetite change, chills, fatigue and fever.  Respiratory: Negative for chest tightness and shortness of breath.   Cardiovascular: Negative for chest pain and palpitations.  Gastrointestinal: Negative for abdominal pain, nausea and vomiting.  Neurological: Negative for  dizziness and weakness.    Social History   Tobacco Use  . Smoking status: Never Smoker  . Smokeless tobacco: Never Used  Substance Use Topics  . Alcohol use: Yes    Alcohol/week: 0.0 standard drinks    Comment: occasional glass of wine      Objective:   BP 120/84 (BP Location: Right Arm, Patient Position: Sitting, Cuff Size: Normal)   Pulse 95   Temp 98.3 F (36.8 C) (Oral)   Ht 5\' 5"  (1.651 m)   Wt 165 lb (74.8 kg)   SpO2 99%   BMI 27.46 kg/m     Physical Exam  General appearance: alert, well developed, well nourished, cooperative and in no distress Head: Normocephalic, without obvious abnormality, atraumatic Respiratory: Respirations even and unlabored, normal respiratory rate Extremities: No gross deformities Skin: Skin color, texture, turgor normal. No rashes seen  Psych: Appropriate mood and affect. Neurologic: Mental status: Alert, oriented to person, place, and time, thought content appropriate.     Assessment & Plan    1. Situational anxiety Much better with escitalopram which we will continue for now.   2. Decreased libido Likely SE of escitalopram. Offered trial of bupropion instead of stopping the Nuvaring which she had planned on doing. Also discussed trying different medication. However considering how escitalopram has worked will opt to continue it and try adding- buPROPion (WELLBUTRIN XL) 150 MG 24 hr tablet; Take 1 tablet (150 mg total) by mouth daily.  Dispense: 30 tablet; Refill: 2 Advised to call to increase dose if not helping considerable  within the month.       Mila Merry, MD  [facility]

## 2018-05-10 DIAGNOSIS — F411 Generalized anxiety disorder: Secondary | ICD-10-CM | POA: Diagnosis not present

## 2018-05-10 DIAGNOSIS — F1020 Alcohol dependence, uncomplicated: Secondary | ICD-10-CM | POA: Diagnosis not present

## 2018-05-10 DIAGNOSIS — F5105 Insomnia due to other mental disorder: Secondary | ICD-10-CM | POA: Diagnosis not present

## 2018-05-10 DIAGNOSIS — R41840 Attention and concentration deficit: Secondary | ICD-10-CM | POA: Diagnosis not present

## 2018-05-24 ENCOUNTER — Other Ambulatory Visit
Admission: RE | Admit: 2018-05-24 | Discharge: 2018-05-24 | Disposition: A | Discharge status: Home / Self Care | Payer: 59 | Source: Ambulatory Visit | Attending: Psychiatry | Admitting: Psychiatry

## 2018-05-24 DIAGNOSIS — F411 Generalized anxiety disorder: Secondary | ICD-10-CM | POA: Diagnosis not present

## 2018-05-24 DIAGNOSIS — F1020 Alcohol dependence, uncomplicated: Secondary | ICD-10-CM | POA: Diagnosis not present

## 2018-05-24 DIAGNOSIS — F5105 Insomnia due to other mental disorder: Secondary | ICD-10-CM | POA: Diagnosis not present

## 2018-05-24 DIAGNOSIS — R41840 Attention and concentration deficit: Secondary | ICD-10-CM | POA: Diagnosis not present

## 2018-05-24 LAB — COMPREHENSIVE METABOLIC PANEL
ALK PHOS: 60 U/L (ref 38–126)
ALT: 22 U/L (ref 0–44)
AST: 26 U/L (ref 15–41)
Albumin: 4.2 g/dL (ref 3.5–5.0)
Anion gap: 8 (ref 5–15)
BUN: 8 mg/dL (ref 6–20)
CALCIUM: 9.3 mg/dL (ref 8.9–10.3)
CO2: 25 mmol/L (ref 22–32)
CREATININE: 0.68 mg/dL (ref 0.44–1.00)
Chloride: 104 mmol/L (ref 98–111)
GFR calc Af Amer: >60 mL/min (ref >60)
Glucose, Bld: 94 mg/dL (ref 70–99)
Potassium: 3.9 mmol/L (ref 3.5–5.1)
SODIUM: 137 mmol/L (ref 135–145)
Total Bilirubin: 0.3 mg/dL (ref 0.3–1.2)
Total Protein: 7.5 g/dL (ref 6.5–8.1)

## 2018-05-24 LAB — CBC WITH DIFFERENTIAL/PLATELET
ABS IMMATURE GRANULOCYTES: 0.02 10*3/uL (ref 0.00–0.07)
Basophils Absolute: 0.1 10*3/uL (ref 0.0–0.1)
Basophils Relative: 1 %
Eosinophils Absolute: 0.2 10*3/uL (ref 0.0–0.5)
Eosinophils Relative: 2 %
HCT: 41.7 % (ref 36.0–46.0)
HEMOGLOBIN: 13.9 g/dL (ref 12.0–15.0)
IMMATURE GRANULOCYTES: 0 %
Lymphocytes Relative: 39 %
Lymphs Abs: 2.6 10*3/uL (ref 0.7–4.0)
MCH: 30.2 pg (ref 26.0–34.0)
MCHC: 33.3 g/dL (ref 30.0–36.0)
MCV: 90.7 fL (ref 80.0–100.0)
MONO ABS: 0.4 10*3/uL (ref 0.1–1.0)
MONOS PCT: 6 %
NEUTROS ABS: 3.4 10*3/uL (ref 1.7–7.7)
Neutrophils Relative %: 52 %
Platelets: 307 10*3/uL (ref 150–400)
RBC: 4.6 MIL/uL (ref 3.87–5.11)
RDW: 12.1 % (ref 11.5–15.5)
WBC: 6.5 10*3/uL (ref 4.0–10.5)
nRBC: 0 % (ref 0.0–0.2)

## 2018-05-24 LAB — VITAMIN B12: Vitamin B-12: 160 pg/mL — ABNORMAL LOW (ref 180–914)

## 2018-05-24 LAB — FOLATE: FOLATE: 7.1 ng/mL (ref >5.9)

## 2018-05-24 LAB — TSH: TSH: 2.313 u[IU]/mL (ref 0.350–4.500)

## 2018-06-15 DIAGNOSIS — F5105 Insomnia due to other mental disorder: Secondary | ICD-10-CM | POA: Diagnosis not present

## 2018-06-15 DIAGNOSIS — R41840 Attention and concentration deficit: Secondary | ICD-10-CM | POA: Diagnosis not present

## 2018-06-15 DIAGNOSIS — F1020 Alcohol dependence, uncomplicated: Secondary | ICD-10-CM | POA: Diagnosis not present

## 2018-06-15 DIAGNOSIS — F411 Generalized anxiety disorder: Secondary | ICD-10-CM | POA: Diagnosis not present

## 2018-07-06 DIAGNOSIS — F5105 Insomnia due to other mental disorder: Secondary | ICD-10-CM | POA: Diagnosis not present

## 2018-07-06 DIAGNOSIS — F1020 Alcohol dependence, uncomplicated: Secondary | ICD-10-CM | POA: Diagnosis not present

## 2018-07-06 DIAGNOSIS — F411 Generalized anxiety disorder: Secondary | ICD-10-CM | POA: Diagnosis not present

## 2018-07-06 DIAGNOSIS — R41840 Attention and concentration deficit: Secondary | ICD-10-CM | POA: Diagnosis not present

## 2018-08-25 DIAGNOSIS — F411 Generalized anxiety disorder: Secondary | ICD-10-CM | POA: Diagnosis not present

## 2018-08-25 DIAGNOSIS — F5105 Insomnia due to other mental disorder: Secondary | ICD-10-CM | POA: Diagnosis not present

## 2018-08-25 DIAGNOSIS — R41840 Attention and concentration deficit: Secondary | ICD-10-CM | POA: Diagnosis not present

## 2018-08-25 DIAGNOSIS — F1020 Alcohol dependence, uncomplicated: Secondary | ICD-10-CM | POA: Diagnosis not present

## 2018-09-12 ENCOUNTER — Other Ambulatory Visit: Payer: Self-pay

## 2018-09-12 ENCOUNTER — Encounter: Payer: Self-pay | Admitting: Family Medicine

## 2018-09-12 ENCOUNTER — Ambulatory Visit: Payer: 59 | Admitting: Family Medicine

## 2018-09-12 VITALS — BP 111/73 | HR 92 | Temp 99.0°F | Wt 168.0 lb

## 2018-09-12 DIAGNOSIS — F988 Other specified behavioral and emotional disorders with onset usually occurring in childhood and adolescence: Secondary | ICD-10-CM

## 2018-09-12 MED ORDER — AMPHETAMINE-DEXTROAMPHET ER 5 MG PO CP24
5.0000 mg | ORAL_CAPSULE | Freq: Every day | ORAL | 0 refills | Status: DC
Start: 2018-09-12 — End: 2018-10-10

## 2018-09-12 NOTE — Progress Notes (Signed)
Patient: Tracey Robinson Female    DOB: 10/19/1990   28 y.o.   MRN: [id number] Visit Date: 09/12/2018  Today's Provider: Mila Merryonald Fisher, MD   Chief Complaint  Patient presents with  . ADHD   Subjective:     HPI   Pt is coming in today to discuss treatment for ADHD.  She states her Anxiety is under good control with current medications managed by Dr. Maryruth BunKapur. Dr. Maryruth BunKapur had intended to refer patient to Dr. Joni FearsPolli in May for formal ADHD testing, but patient has been unable to schedule appointment partially due to Covid-19 pandemic, but also him being out of network and co-pay is much too expensive. 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 States she feels like she has had ADD as far back as middle schools but was never treated with medications.   Allergies  Allergen Reactions  . Penicillins Rash     Current Outpatient Medications:  .  ALPRAZolam (XANAX) 0.25 MG tablet, TAKE 1 TABLET (0.25 MG TOTAL) BY MOUTH EVERY 6 (SIX) HOURS AS NEEDED., Disp: 30 tablet, Rfl: 1 .  buPROPion (WELLBUTRIN XL) 150 MG 24 hr tablet, Take 1 tablet (150 mg total) by mouth daily., Disp: 30 tablet, Rfl: 2 .  escitalopram (LEXAPRO) 10 MG tablet, 1/2 tablet daily for 6 days, then increase to 1 tablet daily, Disp: 30 tablet, Rfl: 3 .  sertraline (ZOLOFT) 100 MG tablet, , Disp: , Rfl:  .  traZODone (DESYREL) 100 MG tablet, , Disp: , Rfl:  .  traZODone (DESYREL) 50 MG tablet, , Disp: , Rfl:  .  etonogestrel-ethinyl estradiol (NUVARING) 0.12-0.015 MG/24HR vaginal ring, Insert vaginally and leave in place for 3 consecutive weeks, then remove for 1 week. (Patient not taking: Reported on 09/12/2018), Disp: 1 each, Rfl: 11 .  Melatonin 10 MG TABS, Take by mouth., Disp: , Rfl:  .  misoprostol (CYTOTEC) 200 MCG tablet, Place 1 tablet (200 mcg total) vaginally once for 1 dose. At bedtime evening prior to procedure, Disp: 1 tablet, Rfl: 0  Review of Systems   Constitutional: Negative.   Respiratory: Negative.   Neurological: Negative for dizziness, light-headedness and headaches.  Psychiatric/Behavioral: Positive for decreased concentration. Negative for behavioral problems, dysphoric mood, hallucinations, self-injury, sleep disturbance and suicidal ideas. The patient is not nervous/anxious and is not hyperactive.     Social History   Tobacco Use  . Smoking status: Never Smoker  . Smokeless tobacco: Never Used  Substance Use Topics  . Alcohol use: Yes    Alcohol/week: 0.0 standard drinks    Comment: occasional glass of wine      Objective:   BP 111/73 (BP Location: Right Arm, Patient Position: Sitting, Cuff Size: Normal)   Pulse 92   Temp 99 F (37.2 C) (Oral)   Wt 168 lb (76.2 kg)   LMP 09/09/2018   BMI 27.96 kg/m  Vitals:   09/12/18 1630  BP: 111/73  Pulse: 92  Temp: 99 F (37.2 C)  TempSrc: Oral  Weight: 168 lb (76.2 kg)     Physical Exam  General appearance: alert, well developed, well nourished, cooperative and in no distress Head: Normocephalic, without obvious abnormality, atraumatic Respiratory: Respirations even and unlabored, normal respiratory rate Extremities: No gross deformities Skin: Skin color, texture, turgor normal. No rashes seen  Psych: Appropriate mood and affect. Neurologic: Mental status: Alert, oriented to person, place, and time, thought content appropriate.  18  point ASRS-v1.1 c/w ADHD    Assessment & Plan    1. Attention deficit disorder, unspecified hyperactivity presence ASRS-v1.1 consistent with ADD. Discussed various treatment modalities and she prefers to try stimulant ADD medications. Discussed generic versus Trade name alternatives. Will try- amphetamine-dextroamphetamine (ADDERALL XR) 5 MG 24 hr capsule; Take 1 capsule (5 mg total) by mouth daily.  Dispense: 30 capsule; Refill: 0 Follow up in a month        Lelon Huh, MD  [facility]

## 2018-09-19 ENCOUNTER — Encounter: Payer: Self-pay | Admitting: Family Medicine

## 2018-09-19 NOTE — Patient Instructions (Signed)
.   Please review the attached list of medications and notify my office if there are any errors.   . Please bring all of your medications to every appointment so we can make sure that our medication list is the same as yours.   . We will have flu vaccines available after Labor Day. Please go to your pharmacy or call the office in early September to schedule you flu shot.   

## 2018-10-10 ENCOUNTER — Encounter: Payer: Self-pay | Admitting: Family Medicine

## 2018-10-10 ENCOUNTER — Other Ambulatory Visit: Payer: Self-pay

## 2018-10-10 ENCOUNTER — Ambulatory Visit (INDEPENDENT_AMBULATORY_CARE_PROVIDER_SITE_OTHER): Payer: 59 | Admitting: Family Medicine

## 2018-10-10 VITALS — BP 116/77 | HR 91 | Temp 98.3°F | Wt 167.0 lb

## 2018-10-10 DIAGNOSIS — F988 Other specified behavioral and emotional disorders with onset usually occurring in childhood and adolescence: Secondary | ICD-10-CM | POA: Diagnosis not present

## 2018-10-10 MED ORDER — AMPHETAMINE-DEXTROAMPHETAMINE 20 MG PO TABS: 20.0000 mg | ORAL_TABLET | Freq: Every day | ORAL | 0 refills | Status: DC

## 2018-10-10 NOTE — Patient Instructions (Signed)
.   Please review the attached list of medications and notify my office if there are any errors.   . Please bring all of your medications to every appointment so we can make sure that our medication list is the same as yours.   . We will have flu vaccines available after Labor Day. Please go to your pharmacy or call the office in early September to schedule you flu shot.   

## 2018-10-10 NOTE — Progress Notes (Signed)
Patient: Tracey Robinson Female    DOB: [birth year]   28 y.o.   MRN: [id number] Visit Date: 10/10/2018  Today's Provider: Lelon Huh, MD   No chief complaint on file.  Subjective:     HPI    Follow up for ADHD  The patient was last seen for this 4 weeks ago. Changes made at last visit include started adderall XR 5mg  a day.  She reports excellent compliance with treatment. She feels that condition is Unchanged.  Pt stated the 5mg  has not helped at all with her symptoms, but she did titrate herself up to 20mg  in a day which she states worked very well and did not cause any difficulty with sleepiness or restlessness. In fact she states she felt more calm at that dose.  She is not having side effects.   ------------------------------------------------------------------------------------    Allergies  Allergen Reactions  . Penicillins Rash     Current Outpatient Medications:  .  ALPRAZolam (XANAX) 0.25 MG tablet, TAKE 1 TABLET (0.25 MG TOTAL) BY MOUTH EVERY 6 (SIX) HOURS AS NEEDED., Disp: 30 tablet, Rfl: 1 .  amphetamine-dextroamphetamine (ADDERALL XR) 5 MG 24 hr capsule, Take 1 capsule (5 mg total) by mouth daily., Disp: 30 capsule, Rfl: 0 .  buPROPion (WELLBUTRIN XL) 150 MG 24 hr tablet, Take 1 tablet (150 mg total) by mouth daily., Disp: 30 tablet, Rfl: 2 .  Cyanocobalamin 3000 MCG CAPS, Take by mouth., Disp: , Rfl:  .  sertraline (ZOLOFT) 100 MG tablet, , Disp: , Rfl:  .  traZODone (DESYREL) 100 MG tablet, , Disp: , Rfl:  .  escitalopram (LEXAPRO) 10 MG tablet, 1/2 tablet daily for 6 days, then increase to 1 tablet daily, Disp: 30 tablet, Rfl: 3 .  etonogestrel-ethinyl estradiol (NUVARING) 0.12-0.015 MG/24HR vaginal ring, Insert vaginally and leave in place for 3 consecutive weeks, then remove for 1 week. (Patient not taking: Reported on 09/12/2018), Disp: 1 each, Rfl: 11 .  Melatonin 10 MG TABS, Take by mouth., Disp: , Rfl:  .  misoprostol (CYTOTEC) 200 MCG tablet,  Place 1 tablet (200 mcg total) vaginally once for 1 dose. At bedtime evening prior to procedure, Disp: 1 tablet, Rfl: 0 .  traZODone (DESYREL) 50 MG tablet, , Disp: , Rfl:   Review of Systems  Constitutional: Negative.   Respiratory: Negative.   Gastrointestinal: Negative.   Musculoskeletal: Negative.   Neurological: Negative for dizziness, light-headedness and headaches.  Psychiatric/Behavioral: Positive for decreased concentration. Negative for agitation, behavioral problems, confusion, dysphoric mood, hallucinations, self-injury, sleep disturbance and suicidal ideas. The patient is nervous/anxious. The patient is not hyperactive.     Social History   Tobacco Use  . Smoking status: Never Smoker  . Smokeless tobacco: Never Used  Substance Use Topics  . Alcohol use: Yes    Alcohol/week: 0.0 standard drinks    Comment: occasional glass of wine      Objective:   BP 116/77 (BP Location: Right Arm, Patient Position: Sitting, Cuff Size: Normal)   Pulse 91   Temp 98.3 F (36.8 C) (Oral)   Wt 167 lb (75.8 kg)   BMI 27.79 kg/m  Vitals:   10/10/18 1629  BP: 116/77  Pulse: 91  Temp: 98.3 F (36.8 C)  TempSrc: Oral  Weight: 167 lb (75.8 kg)     Physical Exam  General appearance: alert, well developed, well nourished, cooperative and in no distress Head: Normocephalic, without obvious abnormality, atraumatic Respiratory: Respirations even and unlabored, normal respiratory  rate Extremities: No gross deformities Skin: Skin color, texture, turgor normal. No rashes seen  Psych: Appropriate mood and affect. Neurologic: Mental status: Alert, oriented to person, place, and time, thought content appropriate.     Assessment & Plan    1. Attention deficit disorder (ADD) without hyperactivity Tolerating Adderall well, but required titration up to 20mgCiWeAtquJz$  daily for efficacy.  Prescription - amphetamine-dextroamphetamine (ADDERALL) 20 MG tablet; Take 1 tablet (20 mg total) by mouth  daily.  Dispense: 30 tablet; Refill: 0  Future Appointments  Date Time Provider Department Center  12/19/2018  4:00 PM , Demetrios Isaacsonald E, MD [facility] None       Mila Merryonald , MD  [facility]

## 2018-10-24 DIAGNOSIS — F988 Other specified behavioral and emotional disorders with onset usually occurring in childhood and adolescence: Secondary | ICD-10-CM | POA: Insufficient documentation

## 2018-11-07 ENCOUNTER — Other Ambulatory Visit: Payer: Self-pay | Admitting: Family Medicine

## 2018-11-07 DIAGNOSIS — F988 Other specified behavioral and emotional disorders with onset usually occurring in childhood and adolescence: Secondary | ICD-10-CM

## 2018-11-08 DIAGNOSIS — R41840 Attention and concentration deficit: Secondary | ICD-10-CM | POA: Diagnosis not present

## 2018-11-08 DIAGNOSIS — F411 Generalized anxiety disorder: Secondary | ICD-10-CM | POA: Diagnosis not present

## 2018-11-08 DIAGNOSIS — F1020 Alcohol dependence, uncomplicated: Secondary | ICD-10-CM | POA: Diagnosis not present

## 2018-11-08 DIAGNOSIS — F5105 Insomnia due to other mental disorder: Secondary | ICD-10-CM | POA: Diagnosis not present

## 2018-12-08 ENCOUNTER — Other Ambulatory Visit: Payer: Self-pay | Admitting: Family Medicine

## 2018-12-08 DIAGNOSIS — F988 Other specified behavioral and emotional disorders with onset usually occurring in childhood and adolescence: Secondary | ICD-10-CM

## 2018-12-19 ENCOUNTER — Ambulatory Visit (INDEPENDENT_AMBULATORY_CARE_PROVIDER_SITE_OTHER): Payer: 59 | Admitting: Family Medicine

## 2018-12-19 ENCOUNTER — Ambulatory Visit: Payer: 59 | Admitting: Family Medicine

## 2018-12-19 ENCOUNTER — Encounter: Payer: Self-pay | Admitting: Family Medicine

## 2018-12-19 ENCOUNTER — Telehealth: Payer: Self-pay

## 2018-12-19 ENCOUNTER — Other Ambulatory Visit: Payer: Self-pay

## 2018-12-19 DIAGNOSIS — F418 Other specified anxiety disorders: Secondary | ICD-10-CM | POA: Diagnosis not present

## 2018-12-19 DIAGNOSIS — F988 Other specified behavioral and emotional disorders with onset usually occurring in childhood and adolescence: Secondary | ICD-10-CM | POA: Diagnosis not present

## 2018-12-19 NOTE — Telephone Encounter (Signed)
Arranged a virtual visit today at 3pm

## 2018-12-19 NOTE — Progress Notes (Signed)
Patient: Tracey Robinson Female    DOB: 1991/02/21   28 y.o.   MRN: [id number] Visit Date: 12/19/2018  Today's Provider: Mila Merry, MD   Virtual Visit via Video Note  I connected with Tracey Robinson on 12/19/18 at  3:00 PM EDT by a video enabled telemedicine application and verified that I am speaking with the correct person using two identifiers.  Location: Patient: home Provider: [facility]   I discussed the limitations of evaluation and management by telemedicine and the availability of in person appointments. The patient expressed understanding and agreed to proceed.      Chief Complaint  Patient presents with  . ADHD   Subjective:     HPI   Follow up for ADHD  The patient was last seen for this 2 months ago. Changes made at last visit include required titration up to 20mg  for efficacy.  She reports excellent compliance with treatment. She feels that condition is Improved. She is not having side effects.  Sleeping well, has stopped alprazolam all together, only taking 1/2 -1 trazodone at bedtime and sleeping well, staying well focused at work and having no trouble completing tasks. Anxiety is much better.  ------------------------------------------------------------------------------------  Allergies  Allergen Reactions  . Penicillins Rash     Current Outpatient Medications:  .  amphetamine-dextroamphetamine (ADDERALL) 20 MG tablet, TAKE 1 TABLET BY MOUTH DAILY., Disp: 30 tablet, Rfl: 0 .  Cyanocobalamin 3000 MCG CAPS, Take by mouth., Disp: , Rfl:  .  sertraline (ZOLOFT) 100 MG tablet, , Disp: , Rfl:  .  traZODone (DESYREL) 100 MG tablet, , Disp: , Rfl:  .  ALPRAZolam (XANAX) 0.25 MG tablet, TAKE 1 TABLET (0.25 MG TOTAL) BY MOUTH EVERY 6 (SIX) HOURS AS NEEDED. (Patient not taking: Reported on 12/19/2018), Disp: 30 tablet, Rfl: 1  Review of Systems  Constitutional: Negative.   HENT: Negative.   Cardiovascular: Negative.    Gastrointestinal: Negative.   Musculoskeletal: Negative.   Neurological: Negative.     Social History   Tobacco Use  . Smoking status: Never Smoker  . Smokeless tobacco: Never Used  Substance Use Topics  . Alcohol use: Yes    Alcohol/week: 0.0 standard drinks    Comment: occasional glass of wine      Objective:    There were no vitals filed for this visit.There is no height or weight on file to calculate BMI.   Physical Exam   General appearance: Well developed, well nourished female, cooperative and in no acute distress Head: Normocephalic, without obvious abnormality, atraumatic Respiratory: Respirations even and unlabored, normal respiratory rate Extremities: All extremities are intact.  Skin: Skin color, texture, turgor normal. No rashes seen  Psych: Appropriate mood and affect. Neurologic: Mental status: Alert, oriented to person, place, and time, thought content appropriate.      Assessment & Plan    1. Attention deficit disorder (ADD) without hyperactivity Well controlled on current dose of sertraline and Adderall.   2. Situational anxiety Well controlled. Continue current medications.     The entirety of the information documented in the History of Present Illness, Review of Systems and Physical Exam were personally obtained by me. Portions of this information were initially documented by 02/18/2019, CMA and reviewed by me for thoroughness and accuracy.    I discussed the assessment and treatment plan with the patient. The patient was provided an opportunity to ask questions and all were answered. The patient agreed with the plan and  demonstrated an understanding of the instructions.   The patient was advised to call back or seek an in-person evaluation if the symptoms worsen or if the condition fails to improve as anticipated.  I provided 8 minutes of non-face-to-face time during this encounter.    Lelon Huh, MD  [facility]

## 2018-12-19 NOTE — Telephone Encounter (Signed)
While doing the prescreening patient reports that her husband tested positive this Saturday for the Coronavirus. Can patient do a virtual visit if possible? Please advise and call patient back. Thanks. Patient is aware that she cannot come in to the office but if not able to do a virtual appt will be cancel.

## 2019-01-09 ENCOUNTER — Other Ambulatory Visit: Payer: Self-pay | Admitting: Family Medicine

## 2019-01-09 DIAGNOSIS — F988 Other specified behavioral and emotional disorders with onset usually occurring in childhood and adolescence: Secondary | ICD-10-CM

## 2019-01-09 NOTE — Telephone Encounter (Signed)
Last OV 12/19/2018 and last refill 12/08/2018

## 2019-01-10 MED ORDER — AMPHETAMINE-DEXTROAMPHETAMINE 20 MG PO TABS
20.0000 mg | ORAL_TABLET | Freq: Every day | ORAL | 0 refills | Status: DC
Start: 2019-01-10 — End: 2019-02-09

## 2019-02-09 ENCOUNTER — Other Ambulatory Visit: Payer: Self-pay | Admitting: Family Medicine

## 2019-02-09 DIAGNOSIS — F988 Other specified behavioral and emotional disorders with onset usually occurring in childhood and adolescence: Secondary | ICD-10-CM

## 2019-02-09 MED ORDER — AMPHETAMINE-DEXTROAMPHETAMINE 20 MG PO TABS: 20.0000 mg | ORAL_TABLET | Freq: Every day | ORAL | 0 refills | Status: DC

## 2019-03-08 ENCOUNTER — Other Ambulatory Visit: Payer: Self-pay | Admitting: Family Medicine

## 2019-03-08 DIAGNOSIS — F988 Other specified behavioral and emotional disorders with onset usually occurring in childhood and adolescence: Secondary | ICD-10-CM

## 2019-03-09 MED ORDER — AMPHETAMINE-DEXTROAMPHETAMINE 20 MG PO TABS: 20.0000 mg | ORAL_TABLET | Freq: Every day | ORAL | 0 refills | Status: DC

## 2019-03-14 DIAGNOSIS — F5105 Insomnia due to other mental disorder: Secondary | ICD-10-CM | POA: Diagnosis not present

## 2019-03-14 DIAGNOSIS — R41840 Attention and concentration deficit: Secondary | ICD-10-CM | POA: Diagnosis not present

## 2019-03-14 DIAGNOSIS — F411 Generalized anxiety disorder: Secondary | ICD-10-CM | POA: Diagnosis not present

## 2019-04-12 ENCOUNTER — Other Ambulatory Visit: Payer: Self-pay | Admitting: Family Medicine

## 2019-04-12 DIAGNOSIS — F988 Other specified behavioral and emotional disorders with onset usually occurring in childhood and adolescence: Secondary | ICD-10-CM

## 2019-04-12 MED ORDER — AMPHETAMINE-DEXTROAMPHETAMINE 20 MG PO TABS: 20.0000 mg | ORAL_TABLET | Freq: Every day | ORAL | 0 refills | Status: DC

## 2019-04-14 ENCOUNTER — Ambulatory Visit: Payer: 59 | Admitting: Obstetrics and Gynecology

## 2019-05-08 ENCOUNTER — Other Ambulatory Visit (HOSPITAL_COMMUNITY)
Admission: RE | Admit: 2019-05-08 | Discharge: 2019-05-08 | Disposition: A | Discharge status: Home / Self Care | Payer: 59 | Source: Ambulatory Visit | Attending: Obstetrics and Gynecology | Admitting: Obstetrics and Gynecology

## 2019-05-08 ENCOUNTER — Ambulatory Visit (INDEPENDENT_AMBULATORY_CARE_PROVIDER_SITE_OTHER): Payer: 59 | Admitting: Obstetrics and Gynecology

## 2019-05-08 ENCOUNTER — Ambulatory Visit: Payer: 59 | Admitting: Obstetrics and Gynecology

## 2019-05-08 ENCOUNTER — Encounter: Payer: Self-pay | Admitting: Obstetrics and Gynecology

## 2019-05-08 ENCOUNTER — Other Ambulatory Visit: Payer: Self-pay

## 2019-05-08 VITALS — BP 118/84 | HR 114 | Ht 65.0 in | Wt 150.0 lb

## 2019-05-08 DIAGNOSIS — Z30015 Encounter for initial prescription of vaginal ring hormonal contraceptive: Secondary | ICD-10-CM

## 2019-05-08 DIAGNOSIS — Z113 Encounter for screening for infections with a predominantly sexual mode of transmission: Secondary | ICD-10-CM

## 2019-05-08 DIAGNOSIS — Z01419 Encounter for gynecological examination (general) (routine) without abnormal findings: Secondary | ICD-10-CM | POA: Diagnosis not present

## 2019-05-08 DIAGNOSIS — Z124 Encounter for screening for malignant neoplasm of cervix: Secondary | ICD-10-CM | POA: Insufficient documentation

## 2019-05-08 DIAGNOSIS — Z1239 Encounter for other screening for malignant neoplasm of breast: Secondary | ICD-10-CM

## 2019-05-08 DIAGNOSIS — Z13220 Encounter for screening for lipoid disorders: Secondary | ICD-10-CM | POA: Diagnosis not present

## 2019-05-08 MED ORDER — ETONOGESTREL-ETHINYL ESTRADIOL 0.12-0.015 MG/24HR VA RING: VAGINAL_RING | VAGINAL | 12 refills | Status: DC

## 2019-05-08 NOTE — Progress Notes (Signed)
Gynecology Annual Exam   PCP: Malva Limes, MD  Chief Complaint:  Chief Complaint  Patient presents with  . Gynecologic Exam    Routine labwork    History of Present Illness: Patient is a 29 y.o. G0P0000 presents for annual exam. The patient has no complaints today.   LMP: Patient's last menstrual period was 04/12/2019 (exact date). Average Interval: regular monthly Duration of flow: 4-5 days Heavy Menses: no Clots: no Intermenstrual Bleeding: no Postcoital Bleeding: no Dysmenorrhea: no  The patient is sexually active. She currently uses condoms for contraception.  Patient and husband are swinger and while she reports consistent condom use when with other partners her and her husband have not used condoms consistently in the past 2 years.. She denies dyspareunia.  There is no notable family history of breast or ovarian cancer in her family.  The patient wears seatbelts: yes.   The patient has regular exercise: not asked.    The patient denies current symptoms of depression.    Review of Systems: ROS  Past Medical History:  Past Medical History:  Diagnosis Date  . ADHD   . Anxiety   . Panic attack     Past Surgical History:  Past Surgical History:  Procedure Laterality Date  . NO PAST SURGERIES      Gynecologic History:  Patient's last menstrual period was 04/12/2019 (exact date). Contraception: condoms Last Pap: Results were: 04/27/2017 Colposcopy normal - negative ectocervix and ECC bx 03/22/2017 ASCUS HPV positive  Obstetric History: G0P0000  Family History:  Family History  Problem Relation Age of Onset  . Hyperlipidemia Mother   . Ulcers Father   . Heart attack Father   . Anxiety disorder Father   . Heart attack Paternal Grandfather   . Diabetes Paternal Grandfather   . Anxiety disorder Paternal Grandmother     Social History:  Social History   Socioeconomic History  . Marital status: Single    Spouse name: Not on file  . Number of  children: 0  . Years of education: Not on file  . Highest education level: Not on file  Occupational History  . 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 Smoking status: Never Smoker  . Smokeless tobacco: Never Used  Substance and Sexual Activity  . Alcohol use: Yes    Alcohol/week: 0.0 standard drinks    Comment: occasional glass of wine  . Drug use: No  . Sexual activity: Yes    Birth control/protection: None  Other Topics Concern  . Not on file  Social History Narrative  . Not on file   Social Determinants of Health   Financial Resource Strain:   . Difficulty of Paying Living Expenses: Not on file  Food Insecurity:   . Worried About Programme researcher, broadcasting/film/video in the Last Year: Not on file  . Ran Out of Food in the Last Year: Not on file  Transportation Needs:   . Lack of Transportation (Medical): Not on file  . Lack of Transportation (Non-Medical): Not on file  Physical Activity:   . Days of Exercise per Week: Not on file  . Minutes of Exercise per Session: Not on file  Stress:   . Feeling of Stress : Not on file  Social Connections:   . Frequency of Communication with Friends and Family: Not on file  . Frequency of Social Gatherings with Friends and Family: Not on file  . Attends  Religious Services: Not on file  . Active Member of Clubs or Organizations: Not on file  . Attends Banker Meetings: Not on file  . Marital Status: Not on file  Intimate Partner Violence:   . Fear of Current or Ex-Partner: Not on file  . Emotionally Abused: Not on file  . Physically Abused: Not on file  . Sexually Abused: Not on file    Allergies:  Allergies  Allergen Reactions  . Penicillins Rash    Medications: Prior to Admission medications   Medication Sig Start Date End Date Taking? Authorizing Provider  amphetamine-dextroamphetamine (ADDERALL) 20 MG tablet Take 1 tablet (20 mg total) by mouth daily. 04/12/19   Malva Limes,  MD  Cyanocobalamin 3000 MCG CAPS Take by mouth.    [provider]  sertraline (ZOLOFT) 100 MG tablet  05/24/18   [provider]  traZODone (DESYREL) 100 MG tablet 50-100 mg.  05/24/18   [provider]    Physical Exam Vitals: There were no vitals taken for this visit.  General: NAD HEENT: normocephalic, anicteric Thyroid: no enlargement, no palpable nodules Pulmonary: No increased work of breathing, CTAB Cardiovascular: RRR, distal pulses 2+ Breast: Breast symmetrical, no tenderness, no palpable nodules or masses, no skin or nipple retraction present, no nipple discharge.  No axillary or supraclavicular lymphadenopathy. Abdomen: NABS, soft, non-tender, non-distended.  Umbilicus without lesions.  No hepatomegaly, splenomegaly or masses palpable. No evidence of hernia  Genitourinary:  External: Normal external female genitalia.  Normal urethral meatus, normal Bartholin's and Skene's glands.    Vagina: Normal vaginal mucosa, no evidence of prolapse.    Cervix: Grossly normal in appearance, no bleeding  Uterus: Non-enlarged, mobile, normal contour.  No CMT  Adnexa: ovaries non-enlarged, no adnexal masses  Rectal: deferred  Lymphatic: no evidence of inguinal lymphadenopathy Extremities: no edema, erythema, or tenderness Neurologic: Grossly intact Psychiatric: mood appropriate, affect full  Female chaperone present for pelvic and breast  portions of the physical exam      Assessment: 29 y.o. G0P0000 routine annual exam  Plan: Problem List Items Addressed This Visit    None    Visit Diagnoses    Encounter for gynecological examination without abnormal finding    -  Primary   Screening for malignant neoplasm of cervix       Relevant Orders   Cytology - PAP   Breast screening       Lipid screening       Relevant Orders   Lipid panel (Completed)   Routine screening for STI (sexually transmitted infection)       Relevant Orders   HEP, RPR, HIV Panel  (Completed)   Cytology - PAP   Encounter for initial prescription of vaginal ring hormonal contraceptive          1) STI screening  wasoffered and accepted  2)  ASCCP guidelines and rational discussed.  Patient opts for every 3 years screening interval - repeat pap to follow up on colposcopy 2019  3) Contraception - the patient is currently using  condoms.  She is happy with her current form of contraception and plans to continue  - no interested in conceiving in the near future.  We discussed the rate of spontaneous conception is 20% a month if not actively contracepting.    4) Routine healthcare maintenance including cholesterol, diabetes screening discussed managed by PCP  5) Return in about 1 year (around 05/07/2020) for annual.   Vena Austria, MD, [facility],  [facility] 05/08/2019, 2:52 PM

## 2019-05-09 DIAGNOSIS — R87612 Low grade squamous intraepithelial lesion on cytologic smear of cervix (LGSIL): Secondary | ICD-10-CM | POA: Diagnosis not present

## 2019-05-09 LAB — HEP, RPR, HIV PANEL
HIV Screen 4th Generation wRfx: NONREACTIVE
Hepatitis B Surface Ag: NEGATIVE
RPR Ser Ql: NONREACTIVE

## 2019-05-09 LAB — LIPID PANEL
Chol/HDL Ratio: 3 ratio (ref 0.0–4.4)
Cholesterol, Total: 206 mg/dL — ABNORMAL HIGH (ref 100–199)
HDL: 69 mg/dL (ref >39)
LDL Chol Calc (NIH): 124 mg/dL — ABNORMAL HIGH (ref 0–99)
Triglycerides: 74 mg/dL (ref 0–149)
VLDL Cholesterol Cal: 13 mg/dL (ref 5–40)

## 2019-05-11 LAB — CYTOLOGY - PAP
Chlamydia: NEGATIVE
Comment: NEGATIVE
Comment: NORMAL
Neisseria Gonorrhea: NEGATIVE

## 2019-05-11 NOTE — Progress Notes (Signed)
Colposcopy next 1-4 weeks, patient aware

## 2019-05-12 ENCOUNTER — Telehealth (INDEPENDENT_AMBULATORY_CARE_PROVIDER_SITE_OTHER): Payer: 59 | Admitting: Family Medicine

## 2019-05-12 ENCOUNTER — Encounter: Payer: Self-pay | Admitting: Family Medicine

## 2019-05-12 ENCOUNTER — Other Ambulatory Visit: Payer: Self-pay

## 2019-05-12 ENCOUNTER — Telehealth: Payer: Self-pay | Admitting: Obstetrics and Gynecology

## 2019-05-12 DIAGNOSIS — F988 Other specified behavioral and emotional disorders with onset usually occurring in childhood and adolescence: Secondary | ICD-10-CM | POA: Diagnosis not present

## 2019-05-12 MED ORDER — AMPHETAMINE-DEXTROAMPHETAMINE 20 MG PO TABS
20.0000 mg | ORAL_TABLET | Freq: Two times a day (BID) | ORAL | 0 refills | Status: DC
Start: 2019-05-12 — End: 2019-06-11

## 2019-05-12 NOTE — Telephone Encounter (Signed)
Patient is schedule for 06/19/19 with AMS for Colpo

## 2019-05-12 NOTE — Telephone Encounter (Signed)
-----   Message from Vena Austria, MD sent at 05/11/2019  5:03 PM EST ----- Colposcopy next 1-4 weeks, patient aware

## 2019-05-12 NOTE — Progress Notes (Signed)
Patient: Tracey Robinson Female    DOB: 15-Dec-1990   29 y.o.   MRN: [id number] Visit Date: 05/12/2019  Today's Provider: Mila Merry, MD   Chief Complaint  Patient presents with  . ADD   Subjective:    Virtual Visit via Video Note  I connected with Tracey Robinson on 05/12/19 at  3:20 PM EST by a video enabled telemedicine application and verified that I am speaking with the correct person using two identifiers.  Location: Patient: home Provider: [facility]   I discussed the limitations of evaluation and management by telemedicine and the availability of in person appointments. The patient expressed understanding and agreed to proceed.   HPI  Follow up for ADD:   The patient was last seen for this 4 months ago. Changes made at last visit include no change.  She reports good compliance with treatment. She feels that condition is well controlled on current dose of Adderall. Patient states concentration is good during the day but by afternoon the medication has wore off and she has issues concentrating on school work.  She is not having side effects. Has had no trouble with mood. Has titrated off of sertraline. Is sleeping well. appetite is normal.   ------------------------------------------------------------------------------------  Allergies  Allergen Reactions  . Penicillins Rash     Current Outpatient Medications:  .  amphetamine-dextroamphetamine (ADDERALL) 20 MG tablet, Take 1 tablet (20 mg total) by mouth daily., Disp: 30 tablet, Rfl: 0 .  Cyanocobalamin 3000 MCG CAPS, Take by mouth., Disp: , Rfl:  .  etonogestrel-ethinyl estradiol (NUVARING) 0.12-0.015 MG/24HR vaginal ring, Insert vaginally and leave in place for 3 consecutive weeks, then remove for 1 week., Disp: 1 each, Rfl: 12 .  traZODone (DESYREL) 100 MG tablet, 50-100 mg. , Disp: , Rfl:   Review of Systems  Constitutional: Negative.   Respiratory: Negative.   Cardiovascular: Negative.     Psychiatric/Behavioral: Positive for decreased concentration.    Social History   Tobacco Use  . Smoking status: Never Smoker  . Smokeless tobacco: Never Used  Substance Use Topics  . Alcohol use: Yes    Alcohol/week: 0.0 standard drinks    Comment: occasional glass of wine      Objective:   LMP 04/12/2019 (Exact Date)  There were no vitals filed for this visit.There is no height or weight on file to calculate BMI.   Physical Exam  Awake, alert, oriented x 3. In no apparent distress       Assessment & Plan     1. Attention deficit disorder (ADD) without hyperactivity Doing well with current dose in the am, but is wearing off in the evenings when she needs to study. Will change prescription to BID so she has the option of taking a second tablet on days she has to focus on work or school later in the day.  - amphetamine-dextroamphetamine (ADDERALL) 20 MG tablet; Take 1 tablet (20 mg total) by mouth 2 (two) times daily.  Dispense: 60 tablet; Refill: 0   I discussed the assessment and treatment plan with the patient. The patient was provided an opportunity to ask questions and all were answered. The patient agreed with the plan and demonstrated an understanding of the instructions.   The patient was advised to call back or seek an in-person evaluation if the symptoms worsen or if the condition fails to improve as anticipated.  I provided 12 minutes of non-face-to-face time during this encounter.     Mila Merry,  MD  [place]

## 2019-05-19 DIAGNOSIS — Z03818 Encounter for observation for suspected exposure to other biological agents ruled out: Secondary | ICD-10-CM | POA: Diagnosis not present

## 2019-05-29 DIAGNOSIS — R41840 Attention and concentration deficit: Secondary | ICD-10-CM | POA: Diagnosis not present

## 2019-05-29 DIAGNOSIS — F1021 Alcohol dependence, in remission: Secondary | ICD-10-CM | POA: Diagnosis not present

## 2019-05-29 DIAGNOSIS — F5105 Insomnia due to other mental disorder: Secondary | ICD-10-CM | POA: Diagnosis not present

## 2019-05-29 DIAGNOSIS — F411 Generalized anxiety disorder: Secondary | ICD-10-CM | POA: Diagnosis not present

## 2019-06-11 ENCOUNTER — Other Ambulatory Visit: Payer: Self-pay | Admitting: Family Medicine

## 2019-06-11 DIAGNOSIS — F988 Other specified behavioral and emotional disorders with onset usually occurring in childhood and adolescence: Secondary | ICD-10-CM

## 2019-06-12 MED ORDER — AMPHETAMINE-DEXTROAMPHETAMINE 20 MG PO TABS: 20.0000 mg | ORAL_TABLET | Freq: Two times a day (BID) | ORAL | 0 refills | Status: DC

## 2019-06-19 ENCOUNTER — Encounter: Payer: Self-pay | Admitting: Obstetrics and Gynecology

## 2019-06-19 ENCOUNTER — Ambulatory Visit (INDEPENDENT_AMBULATORY_CARE_PROVIDER_SITE_OTHER): Payer: 59 | Admitting: Obstetrics and Gynecology

## 2019-06-19 ENCOUNTER — Other Ambulatory Visit: Payer: Self-pay

## 2019-06-19 ENCOUNTER — Other Ambulatory Visit (HOSPITAL_COMMUNITY)
Admission: RE | Admit: 2019-06-19 | Discharge: 2019-06-19 | Disposition: A | Discharge status: Home / Self Care | Payer: 59 | Source: Ambulatory Visit | Attending: Obstetrics and Gynecology | Admitting: Obstetrics and Gynecology

## 2019-06-19 VITALS — BP 102/62 | Ht 65.0 in | Wt 144.0 lb

## 2019-06-19 DIAGNOSIS — R87612 Low grade squamous intraepithelial lesion on cytologic smear of cervix (LGSIL): Secondary | ICD-10-CM | POA: Diagnosis not present

## 2019-06-19 DIAGNOSIS — N870 Mild cervical dysplasia: Secondary | ICD-10-CM

## 2019-06-19 MED ORDER — PHEXXI 1.8-1-0.4 % VA GEL: 1.0000 | VAGINAL | 11 refills | Status: AC | PRN

## 2019-06-19 NOTE — Progress Notes (Signed)
   GYNECOLOGY CLINIC COLPOSCOPY PROCEDURE NOTE  29 y.o. G0P0000 here for colposcopy for low-grade squamous intraepithelial neoplasia (LGSIL - encompassing HPV,mild dysplasia,CIN I)  pap smear on 05/08/2019. Preceeding pap 03/22/2017 ASCUS HPV positive with normal colposcopy 04/27/2017.  Discussed underlying role for HPV infection in the development of cervical dysplasia, its natural history and progression/regression, need for surveillance.  Is the patient  pregnant: No LMP: Patient's last menstrual period was 06/14/2019. Smoking status:  reports that she has never smoked. She has never used smokeless tobacco. Contraception: NuvaRing vaginal inserts Future fertility desired:  Yes  Patient given informed consent, signed copy in the chart, time out was performed.  The patient was position in dorsal lithotomy position. Speculum was placed the cervix was visualized.   After application of acetic acid colposcopic inspection of the cervix was undertaken.   Colposcopy adequate, full visualization of transformation zone: Yes Minimal acetowhite lesion(s) noted at 1 o'clock; corresponding biopsies obtained.   ECC specimen obtained:  Yes  All specimens were labeled and sent to pathology.   Patient was given post procedure instructions.  Will follow up pathology and manage accordingly.  Routine preventative health maintenance measures emphasized. In addition patient would like to switch from Nuvaring to Phexxi.  OBGyn Exam  Vena Austria, MD, [facility], [facility]

## 2019-06-21 LAB — SURGICAL PATHOLOGY

## 2019-07-11 ENCOUNTER — Other Ambulatory Visit: Payer: Self-pay | Admitting: Family Medicine

## 2019-07-11 DIAGNOSIS — F988 Other specified behavioral and emotional disorders with onset usually occurring in childhood and adolescence: Secondary | ICD-10-CM

## 2019-07-11 MED ORDER — AMPHETAMINE-DEXTROAMPHETAMINE 20 MG PO TABS: 20.0000 mg | ORAL_TABLET | Freq: Two times a day (BID) | ORAL | 0 refills | Status: DC

## 2019-08-03 ENCOUNTER — Telehealth (INDEPENDENT_AMBULATORY_CARE_PROVIDER_SITE_OTHER): Payer: 59 | Admitting: Physician Assistant

## 2019-08-03 ENCOUNTER — Encounter: Payer: Self-pay | Admitting: Physician Assistant

## 2019-08-03 DIAGNOSIS — S61432A Puncture wound without foreign body of left hand, initial encounter: Secondary | ICD-10-CM

## 2019-08-03 DIAGNOSIS — W540XXA Bitten by dog, initial encounter: Secondary | ICD-10-CM

## 2019-08-03 MED ORDER — DOXYCYCLINE HYCLATE 100 MG PO TABS: 100.0000 mg | ORAL_TABLET | Freq: Two times a day (BID) | ORAL | 0 refills | Status: AC

## 2019-08-03 MED ORDER — MELOXICAM 7.5 MG PO TABS: 7.5000 mg | ORAL_TABLET | Freq: Every day | ORAL | 0 refills | Status: DC

## 2019-08-03 NOTE — Progress Notes (Signed)
MyChart Video Visit    Virtual Visit via Video Note   This visit type was conducted due to national recommendations for restrictions regarding the COVID-19 Pandemic (e.g. social distancing) in an effort to limit this patient's exposure and mitigate transmission in our community. This patient is at least at moderate risk for complications without adequate follow up. This format is felt to be most appropriate for this patient at this time. Physical exam was limited by quality of the video and audio technology used for the visit.   Patient location: Home Provider location: Office    Patient: Tracey Robinson   DOB: [birth year]   29 y.o. Female  MRN: [id number] Visit Date: 08/03/2019  Today's healthcare provider: Trinna Post, PA-C   Chief Complaint  Patient presents with  . Dog bite   [employer] as a scribe for [employer], PA-C.,have documented all relevant documentation on the behalf of Trinna Post, PA-C,as directed by  Trinna Post, PA-C while in the presence of Trinna Post, PA-C.  Subjective    HPI Dog Bite:  Patient reports she was bitten by her dog between thumb and pointer finger on left hand. She states the hand is swollen, painful, a puncture wound, and redness. The incident happen approximately 1 hour ago. She was bitten by her own dog who is up to date on vaccinations and not exhibiting signs of rabies. She is having a difficult time making a fist but is able to move her left fingers. She denies fevers, chills, nausea, vomiting, pus drainage. Last tetanus shot 2017.     Medications: Outpatient Medications Prior to Visit  Medication Sig  . amphetamine-dextroamphetamine (ADDERALL) 20 MG tablet Take 1 tablet (20 mg total) by mouth 2 (two) times daily.  . Cyanocobalamin 3000 MCG CAPS Take by mouth.  . fluticasone (FLONASE) 50 MCG/ACT nasal spray Place into both nostrils as needed for allergies or rhinitis.  Marland Kitchen traZODone (DESYREL) 100 MG  tablet 50-100 mg.    No facility-administered medications prior to visit.    Review of Systems  Constitutional: Negative.   Respiratory: Negative.   Cardiovascular: Negative.   Musculoskeletal: Negative.       Objective    There were no vitals taken for this visit.   Physical Exam Constitutional:      Appearance: Normal appearance.  Pulmonary:     Effort: Pulmonary effort is normal. No respiratory distress.  Musculoskeletal:     Comments: Three superficial appearing puncture wounds in left web space between first and second fingers. There is some mild to moderate swelling, no drainage.   Neurological:     Mental Status: She is alert.        Assessment & Plan    1. Dog bite, initial encounter  Counseled on return precautions and being seen at [facility] in hours by hand surgeon if worsening.   - doxycycline (VIBRA-TABS) 100 MG tablet; Take 1 tablet (100 mg total) by mouth 2 (two) times daily for 7 days.  Dispense: 14 tablet; Refill: 0 - DG Hand Complete Left; Future - meloxicam (MOBIC) 7.5 MG tablet; Take 1 tablet (7.5 mg total) by mouth daily.  Dispense: 30 tablet; Refill: 0    Follow up PRN    I discussed the assessment and treatment plan with the patient. The patient was provided an opportunity to ask questions and all were answered. The patient agreed with the plan and demonstrated an understanding of the instructions.  The patient was advised to call back or seek an in-person evaluation if the symptoms worsen or if the condition fails to improve as anticipated.   ITrey Sailors, PA-C, have reviewed all documentation for this visit. The documentation on 08/03/19 for the exam, diagnosis, procedures, and orders are all accurate and complete.   Maryella Shivers [facility] [phone] (phone) [phone] (fax)  [facility]

## 2019-08-08 ENCOUNTER — Other Ambulatory Visit: Payer: Self-pay | Admitting: Family Medicine

## 2019-08-08 DIAGNOSIS — F988 Other specified behavioral and emotional disorders with onset usually occurring in childhood and adolescence: Secondary | ICD-10-CM

## 2019-08-08 MED ORDER — AMPHETAMINE-DEXTROAMPHETAMINE 20 MG PO TABS: 20.0000 mg | ORAL_TABLET | Freq: Two times a day (BID) | ORAL | 0 refills | Status: DC

## 2019-08-08 NOTE — Telephone Encounter (Signed)
Please advise 

## 2019-09-07 ENCOUNTER — Other Ambulatory Visit: Payer: Self-pay | Admitting: Family Medicine

## 2019-09-07 DIAGNOSIS — F988 Other specified behavioral and emotional disorders with onset usually occurring in childhood and adolescence: Secondary | ICD-10-CM

## 2019-09-07 MED ORDER — AMPHETAMINE-DEXTROAMPHETAMINE 20 MG PO TABS: 20.0000 mg | ORAL_TABLET | Freq: Two times a day (BID) | ORAL | 0 refills | Status: DC

## 2019-09-26 DIAGNOSIS — F1021 Alcohol dependence, in remission: Secondary | ICD-10-CM | POA: Diagnosis not present

## 2019-09-26 DIAGNOSIS — F5105 Insomnia due to other mental disorder: Secondary | ICD-10-CM | POA: Diagnosis not present

## 2019-09-26 DIAGNOSIS — F411 Generalized anxiety disorder: Secondary | ICD-10-CM | POA: Diagnosis not present

## 2019-09-26 DIAGNOSIS — R41840 Attention and concentration deficit: Secondary | ICD-10-CM | POA: Diagnosis not present

## 2019-10-09 ENCOUNTER — Other Ambulatory Visit: Payer: Self-pay | Admitting: Family Medicine

## 2019-10-09 DIAGNOSIS — F988 Other specified behavioral and emotional disorders with onset usually occurring in childhood and adolescence: Secondary | ICD-10-CM

## 2019-10-10 MED ORDER — AMPHETAMINE-DEXTROAMPHETAMINE 20 MG PO TABS: 20.0000 mg | ORAL_TABLET | Freq: Two times a day (BID) | ORAL | 0 refills | Status: DC

## 2019-10-27 ENCOUNTER — Ambulatory Visit: Payer: 59 | Admitting: Family Medicine

## 2019-10-27 ENCOUNTER — Ambulatory Visit: Payer: Self-pay

## 2019-10-27 ENCOUNTER — Other Ambulatory Visit: Payer: Self-pay

## 2019-10-27 ENCOUNTER — Encounter: Payer: Self-pay | Admitting: Family Medicine

## 2019-10-27 VITALS — BP 104/72 | HR 100 | Ht 65.0 in | Wt 141.0 lb

## 2019-10-27 DIAGNOSIS — G8929 Other chronic pain: Secondary | ICD-10-CM

## 2019-10-27 DIAGNOSIS — M999 Biomechanical lesion, unspecified: Secondary | ICD-10-CM

## 2019-10-27 DIAGNOSIS — M25512 Pain in left shoulder: Secondary | ICD-10-CM | POA: Diagnosis not present

## 2019-10-27 DIAGNOSIS — M7552 Bursitis of left shoulder: Secondary | ICD-10-CM | POA: Diagnosis not present

## 2019-10-27 DIAGNOSIS — G2589 Other specified extrapyramidal and movement disorders: Secondary | ICD-10-CM | POA: Diagnosis not present

## 2019-10-27 NOTE — Assessment & Plan Note (Signed)
   Decision today to treat with OMT was based on Physical Exam  After verbal consent patient was treated with HVLA, ME, FPR techniques in  Thoracic areas, all areas are chronic   Patient tolerated the procedure well with improvement in symptoms  Patient given exercises, stretches and lifestyle modifications  See medications in patient instructions if given  Patient will follow up in 4-8 weeks

## 2019-10-27 NOTE — Assessment & Plan Note (Signed)
Mild overall but does have some scapular dyskinesis.  Responded well to osteopathic manipulation.  Home exercises given and work with athletic trainer, meloxicam and discussed, topical anti-inflammatories and icing regimen.  Patient will work on Architectural technologist and follow-up with me again in 4 to 8 weeks

## 2019-10-27 NOTE — Patient Instructions (Signed)
Pennsaid 2x a day Manipulation today Keep hands in peripheral vision  See me 6-8 weeks

## 2019-10-27 NOTE — Progress Notes (Signed)
[facility] [address] [place] [address] Phone: [phone] Subjective:   Bruce Donath, am serving as a scribe for Dr. Antoine Primas. This visit occurred during the SARS-CoV-2 public health emergency.  Safety protocols were in place, including screening questions prior to the visit, additional usage of staff PPE, and extensive cleaning of exam room while observing appropriate contact time as indicated for disinfecting solutions.   I'm seeing this patient by the request  of:  Malva Limes, MD  CC: Left shoulder pain  OHY:[id number]  Tracey Robinson is a 29 y.o. female coming in with complaint of left shoulder pain for 1 year. Pain in shoulder joint. 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 Tension in left scapula. Stopped working out due to pain. Has been getting massages.  Patient states that it is still fairly severe overall.  Describes the pain as a dull, throbbing aching pain.     Past Medical History:  Diagnosis Date  . ADHD   . Anxiety   . Panic attack    Past Surgical History:  Procedure Laterality Date  . NO PAST SURGERIES     Social History   Socioeconomic History  . Marital status: Single    Spouse name: Not on file  . Number of children: 0  . Years of education: Not on file  . Highest education level: Not on file  Occupational History  . 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 Smoking status: Never Smoker  . Smokeless tobacco: Never Used  Vaping Use  . Vaping Use: Never used  Substance and Sexual Activity  . Alcohol use: Yes    Alcohol/week: 0.0 standard drinks    Comment: occasional glass of wine  . Drug use: No  . Sexual activity: Yes    Birth control/protection: None  Other Topics Concern  . Not on file  Social History Narrative  . Not on file   Social Determinants of Health   Financial Resource Strain:   .  Difficulty of Paying Living Expenses: Not on file  Food Insecurity:   . Worried About Programme researcher, broadcasting/film/video in the Last Year: Not on file  . Ran Out of Food in the Last Year: Not on file  Transportation Needs:   . Lack of Transportation (Medical): Not on file  . Lack of Transportation (Non-Medical): Not on file  Physical Activity:   . Days of Exercise per Week: Not on file  . Minutes of Exercise per Session: Not on file  Stress:   . Feeling of Stress : Not on file  Social Connections:   . Frequency of Communication with Friends and Family: Not on file  . Frequency of Social Gatherings with Friends and Family: Not on file  . Attends Religious Services: Not on file  . Active Member of Clubs or Organizations: Not on file  . Attends Banker Meetings: Not on file  . Marital Status: Not on file   Allergies  Allergen Reactions  . Penicillins Rash   Family History  Problem Relation Age of Onset  . Hyperlipidemia Mother   . Ulcers Father   . Heart attack Father   . Anxiety disorder Father   . Heart attack Paternal Grandfather   . Diabetes Paternal Grandfather   . Anxiety disorder Paternal Grandmother       Current Outpatient Medications (Respiratory):  .  fluticasone (FLONASE) 50 MCG/ACT nasal spray, Place into both nostrils as needed for allergies or rhinitis.  Current Outpatient Medications (Analgesics):  .  meloxicam (MOBIC) 7.5 MG tablet, Take 1 tablet (7.5 mg total) by mouth daily.  Current Outpatient Medications (Hematological):  Marland Kitchen  Cyanocobalamin 3000 MCG CAPS, Take by mouth.  Current Outpatient Medications (Other):  .  amphetamine-dextroamphetamine (ADDERALL) 20 MG tablet, Take 1 tablet (20 mg total) by mouth 2 (two) times daily. .  traZODone (DESYREL) 100 MG tablet, 50-100 mg.    Reviewed prior external information including notes and imaging from  primary care provider As well as notes that were available from care everywhere and other healthcare  systems.  Past medical history, social, surgical and family history all reviewed in electronic medical record.  No pertanent information unless stated regarding to the chief complaint.   Review of Systems:  No headache, visual changes, nausea, vomiting, diarrhea, constipation, dizziness, abdominal pain, skin rash, fevers, chills, night sweats, weight loss, swollen lymph nodes, body aches, joint swelling, chest pain, shortness of breath, mood changes. POSITIVE muscle aches  Objective  Blood pressure 104/72, pulse 100, height 5\' 5"  (1.651 m), weight 141 lb (64 kg), SpO2 99 %.   General: No apparent distress alert and oriented x3 mood and affect normal, dressed appropriately.  HEENT: Pupils equal, extraocular movements intact  Respiratory: Patient's speak in full sentences and does not appear short of breath  Cardiovascular: No lower extremity edema, non tender, no erythema  Neuro: Cranial nerves II through XII are intact, neurovascularly intact in all extremities with 2+ DTRs and 2+ pulses.  Gait normal with good balance and coordination.  MSK:   Shoulder: left Inspection reveals no abnormalities, atrophy or asymmetry. Palpation is normal with no tenderness over AC joint or bicipital groove. ROM is full in all planes passively. Rotator cuff strength normal throughout. signs of impingement with positive Neer and Hawkin's tests, but negative empty can sign. Speeds and Yergason's tests normal. No labral pathology noted with negative Obrien's, negative clunk and good stability. Very mild scapular dyskinesis noted  MSK performed of: left This study was ordered, performed, and interpreted by [place] D.O.  Shoulder:   Supraspinatus:  Appears normal on long and transverse views, Bursal bulge seen with shoulder abduction on impingement view. Infraspinatus:  Appears normal on long and transverse views. Significant increase in Doppler flow Subscapularis:  Appears normal on long and  transverse views. Positive bursa Teres Minor:  Appears normal on long and transverse views. AC joint:  Capsule undistended, no geyser sign. Glenohumeral Joint:  Appears normal without effusion. Glenoid Labrum:  Intact without visualized tears. Biceps Tendon:  Appears normal on long and transverse views, no fraying of tendon, tendon located in intertubercular groove, no subluxation with shoulder internal or external rotation.  Impression: Subacromial bursitis   Osteopathic findings T4 extended rotated and side bent left   [id number]; 15 additional minutes spent for Therapeutic exercises as stated in above notes.  This included exercises focusing on stretching, strengthening, with significant focus on eccentric aspects.   Long term goals include an improvement in range of motion, strength, endurance as well as avoiding reinjury. Patient's frequency would include in 1-2 times a day, 3-5 times a week for a duration of 6-12 weeks. Shoulder Exercises that included:  Basic scapular stabilization to include adduction and depression of scapula Scaption, focusing on proper movement and good control Internal and External rotation utilizing a theraband, with elbow tucked at side entire time Rows with  theraband    Proper technique shown and discussed handout in great detail with ATC.  All questions were discussed and answered.     Impression and Recommendations:     The above documentation has been reviewed and is accurate and complete Judi Saa, DO       Note: This dictation was prepared with Dragon dictation along with smaller phrase technology. Any transcriptional errors that result from this process are unintentional.

## 2019-11-10 ENCOUNTER — Other Ambulatory Visit: Payer: Self-pay | Admitting: Family Medicine

## 2019-11-10 DIAGNOSIS — F988 Other specified behavioral and emotional disorders with onset usually occurring in childhood and adolescence: Secondary | ICD-10-CM

## 2019-11-10 NOTE — Telephone Encounter (Signed)
Requested medication (s) are due for refill today: yes  Requested medication (s) are on the active medication list: yes  Last refill:  10/10/2019  Future visit scheduled: yes  Notes to clinic: this refill cannot be delegated    Requested Prescriptions  Pending Prescriptions Disp Refills   amphetamine-dextroamphetamine (ADDERALL) 20 MG tablet [Pharmacy Med Name: AMPHETAMINE SALTS 20 MG TAB 20 Tablet] 60 tablet 0    Sig: TAKE 1 TABLET BY MOUTH TWICE DAILY      Not Delegated - Psychiatry:  Stimulants/ADHD Failed - 11/10/2019  3:15 PM      Failed - This refill cannot be delegated      Failed - Urine Drug Screen completed in last 360 days.      Failed - Valid encounter within last 3 months    Recent Outpatient Visits           3 months ago Dog bite, initial encounter   [facility] Osvaldo Angst M, [place]   6 months ago Attention deficit disorder (ADD) without hyperactivity   [facility] Malva Limes, MD   10 months ago Attention deficit disorder (ADD) without hyperactivity   [facility] Malva Limes, MD   1 year ago Attention deficit disorder (ADD) without hyperactivity   [facility] Malva Limes, MD   1 year ago Attention deficit disorder, unspecified hyperactivity presence   [facility] Malva Limes, MD       Future Appointments             In 4 weeks Judi Saa, DO [facility]

## 2019-11-15 MED ORDER — AMPHETAMINE-DEXTROAMPHETAMINE 20 MG PO TABS: 20.0000 mg | ORAL_TABLET | Freq: Two times a day (BID) | ORAL | 0 refills | Status: DC

## 2019-11-15 NOTE — Telephone Encounter (Signed)
Patient is calling to check the status of her medication request.  She states it has been 5 days and still has not gone to the pharmacy.  Please advise.

## 2019-11-15 NOTE — Telephone Encounter (Signed)
Please review. Dr. Sherrie Mustache is out of the office until Friday 11/17/2019.

## 2019-11-29 ENCOUNTER — Ambulatory Visit: Payer: 59 | Admitting: Family Medicine

## 2019-12-07 ENCOUNTER — Other Ambulatory Visit: Payer: Self-pay | Admitting: Family Medicine

## 2019-12-07 ENCOUNTER — Telehealth: Payer: Self-pay

## 2019-12-07 DIAGNOSIS — F988 Other specified behavioral and emotional disorders with onset usually occurring in childhood and adolescence: Secondary | ICD-10-CM

## 2019-12-07 MED ORDER — AMPHETAMINE-DEXTROAMPHETAMINE 20 MG PO TABS: 20.0000 mg | ORAL_TABLET | Freq: Two times a day (BID) | ORAL | 0 refills | Status: DC

## 2019-12-07 NOTE — Telephone Encounter (Signed)
[id number] 

## 2019-12-07 NOTE — Progress Notes (Signed)
[facility] [address] [place] [address] Phone: [phone] Subjective:   Bruce Donath, am serving as a scribe for Dr. Antoine Primas. This visit occurred during the SARS-CoV-2 public health emergency.  Safety protocols were in place, including screening questions prior to the visit, additional usage of staff PPE, and extensive cleaning of exam room while observing appropriate contact time as indicated for disinfecting solutions.   I'm seeing this patient by the request  of:  Malva Limes, MD  CC: Neck pain and upper back pain follow-up  PJA:[id number]   10/27/2019 Mild overall but does have some scapular dyskinesis.  Responded well to osteopathic manipulation.  Home exercises given and work with athletic trainer, meloxicam and discussed, topical anti-inflammatories and icing regimen.  Patient will work on Architectural technologist and follow-up with me again in 4 to 8 weeks  Update 12/08/2019 Tracey Robinson is a 29 y.o. female coming in with complaint of left shoulder and back pain. Patient states that she is feeling better. Does feel like manipulation helped to alleviate her pain.  Patient states overall doing much better. Patient states significant decrease in pain. Not using any of the meloxicam at the moment. Been able to workout on a more regular basis      Past Medical History:  Diagnosis Date  . ADHD   . Anxiety   . Panic attack    Past Surgical History:  Procedure Laterality Date  . NO PAST SURGERIES     Social History   Socioeconomic History  . Marital status: Married    Spouse name: Not on file  . Number of children: 0  . Years of education: Not on file  . Highest education level: Not on file  Occupational History  . 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 Smoking status: Never Smoker  . Smokeless tobacco: Never Used  Vaping Use  . Vaping Use: Never used  Substance and Sexual  Activity  . Alcohol use: Yes    Alcohol/week: 0.0 standard drinks    Comment: occasional glass of wine  . Drug use: No  . Sexual activity: Yes    Birth control/protection: None  Other Topics Concern  . Not on file  Social History Narrative  . Not on file   Social Determinants of Health   Financial Resource Strain:   . Difficulty of Paying Living Expenses: Not on file  Food Insecurity:   . Worried About Programme researcher, broadcasting/film/video in the Last Year: Not on file  . Ran Out of Food in the Last Year: Not on file  Transportation Needs:   . Lack of Transportation (Medical): Not on file  . Lack of Transportation (Non-Medical): Not on file  Physical Activity:   . Days of Exercise per Week: Not on file  . Minutes of Exercise per Session: Not on file  Stress:   . Feeling of Stress : Not on file  Social Connections:   . Frequency of Communication with Friends and Family: Not on file  . Frequency of Social Gatherings with Friends and Family: Not on file  . Attends Religious Services: Not on file  . Active Member of Clubs or Organizations: Not on file  . Attends Banker Meetings: Not on file  . Marital Status: Not on file   Allergies  Allergen Reactions  . Penicillins Rash   Family History  Problem Relation Age of Onset  .  Hyperlipidemia Mother   . Ulcers Father   . Heart attack Father   . Anxiety disorder Father   . Heart attack Paternal Grandfather   . Diabetes Paternal Grandfather   . Anxiety disorder Paternal Grandmother       Current Outpatient Medications (Respiratory):  .  fluticasone (FLONASE) 50 MCG/ACT nasal spray, Place into both nostrils as needed for allergies or rhinitis.  Current Outpatient Medications (Analgesics):  .  meloxicam (MOBIC) 7.5 MG tablet, Take 1 tablet (7.5 mg total) by mouth daily.  Current Outpatient Medications (Hematological):  Marland Kitchen  Cyanocobalamin 3000 MCG CAPS, Take by mouth.  Current Outpatient Medications (Other):  .   amphetamine-dextroamphetamine (ADDERALL) 20 MG tablet, Take 1 tablet (20 mg total) by mouth 2 (two) times daily. .  traZODone (DESYREL) 100 MG tablet, 50-100 mg.    Reviewed prior external information including notes and imaging from  primary care provider As well as notes that were available from care everywhere and other healthcare systems.  Past medical history, social, surgical and family history all reviewed in electronic medical record.  No pertanent information unless stated regarding to the chief complaint.   Review of Systems:  No headache, visual changes, nausea, vomiting, diarrhea, constipation, dizziness, abdominal pain, skin rash, fevers, chills, night sweats, weight loss, swollen lymph nodes, body aches, joint swelling, chest pain, shortness of breath, mood changes. POSITIVE muscle aches  Objective  Blood pressure 118/84, pulse (!) 123, height 5\' 5"  (1.651 m), weight 136 lb (61.7 kg), SpO2 98 %.   General: No apparent distress alert and oriented x3 mood and affect normal, dressed appropriately.  HEENT: Pupils equal, extraocular movements intact  Respiratory: Patient's speak in full sentences and does not appear short of breath  Cardiovascular: No lower extremity edema, non tender, no erythema  Neuro: Cranial nerves II through XII are intact, neurovascularly intact in all extremities with 2+ DTRs and 2+ pulses.  Gait normal with good balance and coordination.  MSK: Neck exam does have some very mild loss of lordosis, some tenderness to palpation of the paraspinal musculature above the parascapular region left greater than right. Negative Spurling's of the neck. Full range of motion of the neck.  Osteopathic findings C4 flexed rotated and side bent left C6 flexed rotated and side bent left T3 extended rotated and side bent left inhaled third rib T9 extended rotated and side bent right      Impression and Recommendations:     The above documentation has been reviewed  and is accurate and complete , DO

## 2019-12-07 NOTE — Telephone Encounter (Signed)
Spoke with patient on the phone who states that her gynecologist is out of town and is inquiring if we can call in a antibiotic to her pharmacy ( she specifically requested Flagyl) . Patient states that she has a ongoing history of recurrent bacteria vaginosis and states that her gynecologist normally sends her in prescription with no office visit because she has them so frequently. Patient declined office visit offered and stated that she would like prescription sent in today to [facility] outpatient pharmacy. Patient reports symptoms for 7-8 days she states that she has vaginal irritation and stated "it feels like I have butterflies down there. Please advise if appropriate to send in prescription to pharmacy. KW

## 2019-12-07 NOTE — Telephone Encounter (Signed)
No abx without evaluation. I have reviewed chart and I do not see labwork documenting any BV infections. Schedule OV.

## 2019-12-07 NOTE — Telephone Encounter (Signed)
Copied from CRM [id number]. Topic: General - Other >> Dec 07, 2019 12:33 PM Lyn Hollingshead D wrote: PT need to speak with a nurse about a personal manner / please advise

## 2019-12-08 ENCOUNTER — Other Ambulatory Visit: Payer: Self-pay

## 2019-12-08 ENCOUNTER — Ambulatory Visit (INDEPENDENT_AMBULATORY_CARE_PROVIDER_SITE_OTHER): Payer: 59 | Admitting: Family Medicine

## 2019-12-08 ENCOUNTER — Telehealth: Payer: Self-pay

## 2019-12-08 ENCOUNTER — Encounter: Payer: Self-pay | Admitting: Family Medicine

## 2019-12-08 ENCOUNTER — Other Ambulatory Visit: Payer: Self-pay | Admitting: Family Medicine

## 2019-12-08 VITALS — BP 104/76 | HR 115 | Temp 98.2°F | Resp 16 | Wt 137.4 lb

## 2019-12-08 VITALS — BP 118/84 | HR 123 | Ht 65.0 in | Wt 136.0 lb

## 2019-12-08 DIAGNOSIS — B9689 Other specified bacterial agents as the cause of diseases classified elsewhere: Secondary | ICD-10-CM | POA: Diagnosis not present

## 2019-12-08 DIAGNOSIS — N760 Acute vaginitis: Secondary | ICD-10-CM | POA: Diagnosis not present

## 2019-12-08 DIAGNOSIS — G2589 Other specified extrapyramidal and movement disorders: Secondary | ICD-10-CM

## 2019-12-08 DIAGNOSIS — M999 Biomechanical lesion, unspecified: Secondary | ICD-10-CM | POA: Diagnosis not present

## 2019-12-08 LAB — POCT WET PREP WITH KOH
Clue Cells Wet Prep HPF POC: POSITIVE
KOH Prep POC: NEGATIVE
RBC Wet Prep HPF POC: NEGATIVE
Trichomonas, UA: NEGATIVE
Yeast Wet Prep HPF POC: NEGATIVE

## 2019-12-08 MED ORDER — METRONIDAZOLE 0.75 % VA GEL: 1.0000 | Freq: Two times a day (BID) | VAGINAL | 0 refills | Status: DC

## 2019-12-08 NOTE — Patient Instructions (Signed)
See me again in 2-3 months Keep up with exercises Have fun at [facility]

## 2019-12-08 NOTE — Progress Notes (Signed)
Established patient visit   Patient: Tracey Robinson   DOB: 11/27/1990   29 y.o. Female  MRN: [id number] Visit Date: 12/08/2019  Today's healthcare provider: Dortha Kern, PA   Chief Complaint  Patient presents with  . Vaginitis   Subjective    Vaginal Pain The patient's pertinent negatives include no genital itching, genital lesions, genital odor, genital rash, missed menses, pelvic pain, vaginal bleeding or vaginal discharge. Primary symptoms comment: vaginal irritation. This is a new problem. The current episode started in the past 7 days. The problem has been unchanged. She is not pregnant. Pertinent negatives include no abdominal pain, anorexia, back pain, chills, constipation, diarrhea, discolored urine, dysuria, fever, flank pain, frequency, headaches, hematuria, joint pain, joint swelling, nausea, painful intercourse, rash, sore throat, urgency or vomiting. Nothing aggravates the symptoms. Her menstrual history has been regular.      Patient Active Problem List   Diagnosis Date Noted  . Acute shoulder bursitis, left 10/27/2019  . Scapular dyskinesis 10/27/2019  . Nonallopathic lesion of thoracic region 10/27/2019  . Attention deficit disorder (ADD) without hyperactivity 10/24/2018  . Situational anxiety 02/25/2017  . Panic attack 11/02/2014  . Reflux 03/28/2013  . Benign cyst of breast 03/28/2013   Past Medical History:  Diagnosis Date  . ADHD   . Anxiety   . Panic attack    Allergies  Allergen Reactions  . Penicillins Rash     Social History   Tobacco Use  . Smoking status: Never Smoker  . Smokeless tobacco: Never Used  Vaping Use  . Vaping Use: Never used  Substance Use Topics  . Alcohol use: Yes    Alcohol/week: 0.0 standard drinks    Comment: occasional glass of wine  . Drug use: No   Family History  Problem Relation Age of Onset  . Hyperlipidemia Mother   . Ulcers Father   . Heart attack Father   . Anxiety disorder Father   . Heart  attack Paternal Grandfather   . Diabetes Paternal Grandfather   . Anxiety disorder Paternal Grandmother      Medications: Outpatient Medications Prior to Visit  Medication Sig  . amphetamine-dextroamphetamine (ADDERALL) 20 MG tablet Take 1 tablet (20 mg total) by mouth 2 (two) times daily.  . Cyanocobalamin 3000 MCG CAPS Take by mouth.  . fluticasone (FLONASE) 50 MCG/ACT nasal spray Place into both nostrils as needed for allergies or rhinitis.  Marland Kitchen traZODone (DESYREL) 100 MG tablet 50-100 mg.   . meloxicam (MOBIC) 7.5 MG tablet Take 1 tablet (7.5 mg total) by mouth daily. (Patient not taking: Reported on 12/08/2019)   No facility-administered medications prior to visit.    Review of Systems  Constitutional: Negative for chills and fever.  HENT: Negative for sore throat.   Gastrointestinal: Negative for abdominal pain, anorexia, constipation, diarrhea, nausea and vomiting.  Genitourinary: Positive for vaginal pain. Negative for dysuria, flank pain, frequency, hematuria, missed menses, pelvic pain, urgency and vaginal discharge.  Musculoskeletal: Negative for back pain and joint pain.  Skin: Negative for rash.  Neurological: Negative for headaches.      Objective    BP 104/76   Pulse (!) 115   Temp 98.2 F (36.8 C) (Oral)   Resp 16   Wt 137 lb 6.4 oz (62.3 kg)   LMP 11/20/2019 (Exact Date)   SpO2 98%   BMI 22.86 kg/m    Physical Exam Constitutional:      General: She is not in acute distress.  Appearance: She is well-developed.  HENT:     Head: Normocephalic and atraumatic.     Right Ear: Hearing normal.     Left Ear: Hearing normal.     Nose: Nose normal.  Eyes:     General: Lids are normal. No scleral icterus.       Right eye: No discharge.        Left eye: No discharge.     Conjunctiva/sclera: Conjunctivae normal.  Pulmonary:     Effort: Pulmonary effort is normal. No respiratory distress.  Musculoskeletal:        General: Normal range of motion.  Skin:     Findings: No lesion or rash.  Neurological:     Mental Status: She is alert and oriented to person, place, and time.  Psychiatric:        Speech: Speech normal.        Behavior: Behavior normal.        Thought Content: Thought content normal.       No results found for any visits on 12/08/19.  Assessment & Plan     1. Bacterial vaginosis Sensation of returning BV. Usually responds to vaginal treatments. Wet prep with KOH showed clue cells without yeast or trichomonas. In a monogamous relationship without symptoms in partner. Treat with Metrogel and follow up with GYN if symptoms persist or return. - POCT Wet Prep with KOH - metroNIDAZOLE (METROGEL VAGINAL) 0.75 % vaginal gel; Place 1 Applicatorful vaginally 2 (two) times daily.  Dispense: 70 g; Refill: 0  No follow-ups on file.     Haywood Pao, PA, have reviewed all documentation for this visit. The documentation on 12/08/19 for the exam, diagnosis, procedures, and orders are all accurate and complete.     Dortha Kern, PA  [facility] [phone] (phone) [phone] (fax)  [facility]

## 2019-12-08 NOTE — Telephone Encounter (Addendum)
Called patient and no answer, left detailed voicemail (per DPR) advising patient she will need a office visit before any antibiotics can be prescribed and to call the office to schedule appointment.

## 2019-12-08 NOTE — Patient Instructions (Signed)
Bacterial Vaginosis  Bacterial vaginosis is a vaginal infection that occurs when the normal balance of bacteria in the vagina is disrupted. It results from an overgrowth of certain bacteria. This is the most common vaginal infection among women ages 15-44. Because bacterial vaginosis increases your risk for STIs (sexually transmitted infections), getting treated can help reduce your risk for chlamydia, gonorrhea, herpes, and HIV (human immunodeficiency virus). Treatment is also important for preventing complications in pregnant women, because this condition can cause an early (premature) delivery. What are the causes? This condition is caused by an increase in harmful bacteria that are normally present in small amounts in the vagina. However, the reason that the condition develops is not fully understood. What increases the risk? The following factors may make you more likely to develop this condition:  Having a new sexual partner or multiple sexual partners.  Having unprotected sex.  Douching.  Having an intrauterine device (IUD).  Smoking.  Drug and alcohol abuse.  Taking certain antibiotic medicines.  Being pregnant. You cannot get bacterial vaginosis from toilet seats, bedding, swimming pools, or contact with objects around you. What are the signs or symptoms? Symptoms of this condition include:  Grey or white vaginal discharge. The discharge can also be watery or foamy.  A fish-like odor with discharge, especially after sexual intercourse or during menstruation.  Itching in and around the vagina.  Burning or pain with urination. Some women with bacterial vaginosis have no signs or symptoms. How is this diagnosed? This condition is diagnosed based on:  Your medical history.  A physical exam of the vagina.  Testing a sample of vaginal fluid under a microscope to look for a large amount of bad bacteria or abnormal cells. Your health care provider may use a cotton swab or  a small wooden spatula to collect the sample. How is this treated? This condition is treated with antibiotics. These may be given as a pill, a vaginal cream, or a medicine that is put into the vagina (suppository). If the condition comes back after treatment, a second round of antibiotics may be needed. Follow these instructions at home: Medicines  Take over-the-counter and prescription medicines only as told by your health care provider.  Take or use your antibiotic as told by your health care provider. Do not stop taking or using the antibiotic even if you start to feel better. General instructions  If you have a female sexual partner, tell her that you have a vaginal infection. She should see her health care provider and be treated if she has symptoms. If you have a female sexual partner, he does not need treatment.  During treatment: ? Avoid sexual activity until you finish treatment. ? Do not douche. ? Avoid alcohol as directed by your health care provider. ? Avoid breastfeeding as directed by your health care provider.  Drink enough water and fluids to keep your urine clear or pale yellow.  Keep the area around your vagina and rectum clean. ? Wash the area daily with warm water. ? Wipe yourself from front to back after using the toilet.  Keep all follow-up visits as told by your health care provider. This is important. How is this prevented?  Do not douche.  Wash the outside of your vagina with warm water only.  Use protection when having sex. This includes latex condoms and dental dams.  Limit how many sexual partners you have. To help prevent bacterial vaginosis, it is best to have sex with just one partner (  monogamous).  Make sure you and your sexual partner are tested for STIs.  Wear cotton or cotton-lined underwear.  Avoid wearing tight pants and pantyhose, especially during summer.  Limit the amount of alcohol that you drink.  Do not use any products that contain  nicotine or tobacco, such as cigarettes and e-cigarettes. If you need help quitting, ask your health care provider.  Do not use illegal drugs. Where to find more information  Centers for Disease Control and Prevention: www.cdc.gov/std  American Sexual Health Association (ASHA): www.ashastd.org  U.S. Department of Health and Human Services, Office on Women's Health: www.womenshealth.gov/ or https://www.womenshealth.gov/a-z-topics/bacterial-vaginosis Contact a health care provider if:  Your symptoms do not improve, even after treatment.  You have more discharge or pain when urinating.  You have a fever.  You have pain in your abdomen.  You have pain during sex.  You have vaginal bleeding between periods. Summary  Bacterial vaginosis is a vaginal infection that occurs when the normal balance of bacteria in the vagina is disrupted.  Because bacterial vaginosis increases your risk for STIs (sexually transmitted infections), getting treated can help reduce your risk for chlamydia, gonorrhea, herpes, and HIV (human immunodeficiency virus). Treatment is also important for preventing complications in pregnant women, because the condition can cause an early (premature) delivery.  This condition is treated with antibiotic medicines. These may be given as a pill, a vaginal cream, or a medicine that is put into the vagina (suppository). This information is not intended to replace advice given to you by your health care provider. Make sure you discuss any questions you have with your health care provider. Document Revised: 02/05/2017 Document Reviewed: 11/09/2015 Elsevier Patient Education  2020 Elsevier Inc.  

## 2019-12-08 NOTE — Assessment & Plan Note (Signed)
Patient has responded very well to conservative therapy. Patient can increase activity as tolerated. Patient is responding to the manipulation and will follow up with me again in 2 to 3 months at that time if continuing to do better than we will space out to 3 to 4-month intervals

## 2019-12-08 NOTE — Assessment & Plan Note (Signed)
   Decision today to treat with OMT was based on Physical Exam  After verbal consent patient was treated with HVLA, ME, FPR techniques in cervical, thoracic, rib areas, all areas are chronic   Patient tolerated the procedure well with improvement in symptoms  Patient given exercises, stretches and lifestyle modifications  See medications in patient instructions if given  Patient will follow up in 4-8 weeks 

## 2019-12-08 NOTE — Telephone Encounter (Signed)
Copied from CRM [id number]. Topic: General - Other >> Dec 08, 2019  2:25 PM Elliot Gault wrote: Reason for CRM: patient requesting to swab her vaginal at office visit today.

## 2019-12-11 NOTE — Telephone Encounter (Signed)
Patient recently seen by Maurine Minister.

## 2019-12-12 NOTE — Telephone Encounter (Signed)
Tracey Robinson can you sign off on her results please

## 2019-12-27 ENCOUNTER — Ambulatory Visit: Payer: 59 | Admitting: Obstetrics and Gynecology

## 2019-12-27 ENCOUNTER — Other Ambulatory Visit: Payer: Self-pay | Admitting: Obstetrics and Gynecology

## 2019-12-27 ENCOUNTER — Other Ambulatory Visit: Payer: Self-pay

## 2019-12-27 ENCOUNTER — Encounter: Payer: Self-pay | Admitting: Obstetrics and Gynecology

## 2019-12-27 ENCOUNTER — Telehealth: Payer: Self-pay | Admitting: Obstetrics and Gynecology

## 2019-12-27 VITALS — BP 103/73 | Ht 65.0 in | Wt 136.0 lb

## 2019-12-27 DIAGNOSIS — N761 Subacute and chronic vaginitis: Secondary | ICD-10-CM

## 2019-12-27 DIAGNOSIS — R4586 Emotional lability: Secondary | ICD-10-CM | POA: Diagnosis not present

## 2019-12-27 DIAGNOSIS — Z1329 Encounter for screening for other suspected endocrine disorder: Secondary | ICD-10-CM | POA: Diagnosis not present

## 2019-12-27 DIAGNOSIS — N926 Irregular menstruation, unspecified: Secondary | ICD-10-CM | POA: Diagnosis not present

## 2019-12-27 DIAGNOSIS — Z8639 Personal history of other endocrine, nutritional and metabolic disease: Secondary | ICD-10-CM

## 2019-12-27 MED ORDER — SOLOSEC 2 G PO PACK: 2.0000 g | PACK | Freq: Once | ORAL | 0 refills | Status: DC

## 2019-12-27 NOTE — Telephone Encounter (Signed)
Pa done waiting on response, pt aware.

## 2019-12-27 NOTE — Telephone Encounter (Signed)
Patient is calling about the prescription she was given today needing a prior authorization. Please advise

## 2019-12-27 NOTE — Progress Notes (Signed)
Obstetrics & Gynecology Office Visit   Chief Complaint:  Chief Complaint  Patient presents with  . Follow-up    PCP confirmed BV, overly emotional, "butterfly feeling", irregular pain with period, irregular odor in cycle, neg pregnancy test     History of Present Illness: 29 y.o. G0P0000 presenting for evaluation of constellation of recent symptoms that she feels may be hormone related.  She has been unusually emotional, finds herself crying easy, and having a butterfly feeling in her stomach.  Her menstrual cycles decreased in cycle interval length.  She has taken a pregnancy test which was negative.  Also report dysmenorrhea with last few menstrual cycles.   Home UPT was negative.  Associated symptoms also include fatigue.  No additional stressors that the patient can attribute to her symptoms.    In the past the was treated for EtOH induced vitamin B12 deficiency.  States has decreased EtOH use significantly. She did has paraesthesia in extremities at that time but that has resolved.    Also continues to have issues with recurrent BV. Most recently treated by PCP in the past month.  She has previously trialed several different options for cycle control including IUD, nuvaring, and various OCPs but did not do well with any of these.  She is leery in exploring hormonal contraceptives option secondary to this.   Review of Systems:Review of Systems  Constitutional: Positive for malaise/fatigue.  Gastrointestinal: Positive for abdominal pain. Negative for blood in stool, constipation, diarrhea, heartburn, nausea and vomiting.  Genitourinary: Negative.   Skin: Negative.   Neurological: Positive for sensory change.  Psychiatric/Behavioral: Positive for depression. Negative for hallucinations, memory loss, substance abuse and suicidal ideas. The patient is nervous/anxious. The patient does not have insomnia.      Past Medical History:  Past Medical History:  Diagnosis Date  . ADHD    . Anxiety   . Panic attack     Past Surgical History:  Past Surgical History:  Procedure Laterality Date  . NO PAST SURGERIES      Gynecologic History: Patient's last menstrual period was 12/22/2019 (exact date).  Obstetric History: G0P0000  Family History:  Family History  Problem Relation Age of Onset  . Hyperlipidemia Mother   . Ulcers Father   . Heart attack Father   . Anxiety disorder Father   . Heart attack Paternal Grandfather   . Diabetes Paternal Grandfather   . Anxiety disorder Paternal Grandmother     Social History:  Social History   Socioeconomic History  . Marital status: Married    Spouse name: Not on file  . Number of children: 0  . Years of education: Not on file  . Highest education level: Not on file  Occupational History  . 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 Smoking status: Never Smoker  . Smokeless tobacco: Never Used  Vaping Use  . Vaping Use: Never used  Substance and Sexual Activity  . Alcohol use: Yes    Alcohol/week: 0.0 standard drinks    Comment: occasional glass of wine  . Drug use: No  . Sexual activity: Yes    Birth control/protection: None  Other Topics Concern  . Not on file  Social History Narrative  . Not on file   Social Determinants of Health   Financial Resource Strain:   . Difficulty of Paying Living Expenses: Not on file  Food Insecurity:   . Worried About Running  Out of Food in the Last Year: Not on file  . Ran Out of Food in the Last Year: Not on file  Transportation Needs:   . Lack of Transportation (Medical): Not on file  . Lack of Transportation (Non-Medical): Not on file  Physical Activity:   . Days of Exercise per Week: Not on file  . Minutes of Exercise per Session: Not on file  Stress:   . Feeling of Stress : Not on file  Social Connections:   . Frequency of Communication with Friends and Family: Not on file  . Frequency of Social Gatherings with  Friends and Family: Not on file  . Attends Religious Services: Not on file  . Active Member of Clubs or Organizations: Not on file  . Attends Banker Meetings: Not on file  . Marital Status: Not on file  Intimate Partner Violence:   . Fear of Current or Ex-Partner: Not on file  . Emotionally Abused: Not on file  . Physically Abused: Not on file  . Sexually Abused: Not on file    Allergies:  Allergies  Allergen Reactions  . Penicillins Rash    Medications: Prior to Admission medications   Medication Sig Start Date End Date Taking? Authorizing Provider  amphetamine-dextroamphetamine (ADDERALL) 20 MG tablet Take 1 tablet (20 mg total) by mouth 2 (two) times daily. 12/07/19  Yes Malva Limes, MD  Cyanocobalamin 3000 MCG CAPS Take by mouth.   Yes [provider]  fluticasone (FLONASE) 50 MCG/ACT nasal spray Place into both nostrils as needed for allergies or rhinitis.   Yes [provider]  metroNIDAZOLE (METROGEL VAGINAL) 0.75 % vaginal gel Place 1 Applicatorful vaginally 2 (two) times daily. 12/08/19  Yes Chrismon, Jodell Cipro, PA  traZODone (DESYREL) 100 MG tablet 50-100 mg.  05/24/18  Yes [provider]    Physical Exam Vitals:  Vitals:   12/27/19 0824  BP: 103/73   Patient's last menstrual period was 12/22/2019 (exact date).  General: NAD, well nourished, appears stated age HEENT: normocephalic, anicteric Pulmonary: No increased work of breathing  External: Normal external female genitalia.  Normal urethral meatus, normal  Bartholin's and Skene's glands.    Vagina: Normal vaginal mucosa, no evidence of prolapse.    Rectal: deferred Extremities: no edema, erythema, or tenderness Neurologic: Grossly intact Psychiatric: mood appropriate, affect full  Female chaperone present for pelvic  portions of the physical exam  Assessment: 29 y.o. G0P0000 with mood lability, irregular menstrual bleeding (shorting in intervals), and chronic  vaginitis  Plan: Problem List Items Addressed This Visit    None    Visit Diagnoses    Labile mood    -  Primary   Relevant Orders   CBC   Vitamin D (25 hydroxy)   Vitamin B1   Irregular menses       Relevant Orders   CBC   Prolactin   History of non anemic vitamin B12 deficiency       Relevant Orders   B12   Thyroid disorder screening       Relevant Orders   Thyroid Panel With TSH   Chronic vaginitis       Relevant Orders   NuSwab BV and Candida, NAA     1) Mood lability - discussed primary mood disorder as opposed to underlying organic cause is possible.  Has not done well with hormonal contraceptives in the past.  Has follow up visit with psychiatry.   Does have prior history of alcohol  related vitamin B12 deficiency but has significantly curtailed EtOH intake.  - TSH - Vitamin B12 - Vitamin D - CBC  2) Chronic vaginiti - multiple episodes of BV - will try Solosec - NuSwab obtained  3) A total of 15 minutes were spent in face-to-face contact with the patient during this encounter with over half of that time devoted to counseling and coordination of care.  4) Return if symptoms worsen or fail to improve.   Vena Austria, MD, [facility], [facility]

## 2019-12-28 ENCOUNTER — Other Ambulatory Visit: Payer: Self-pay | Admitting: Oral Surgery

## 2019-12-30 LAB — NUSWAB BV AND CANDIDA, NAA
Atopobium vaginae: HIGH Score — AB
Candida albicans, NAA: NEGATIVE
Candida glabrata, NAA: NEGATIVE
Megasphaera 1: HIGH Score — AB

## 2020-01-01 ENCOUNTER — Other Ambulatory Visit: Payer: Self-pay | Admitting: Oral Surgery

## 2020-01-01 LAB — VITAMIN B1: Thiamine: 138.7 nmol/L (ref 66.5–200.0)

## 2020-01-01 LAB — PROLACTIN: Prolactin: 11.1 ng/mL (ref 4.8–23.3)

## 2020-01-01 LAB — CBC
Hematocrit: 45.1 % (ref 34.0–46.6)
Hemoglobin: 15.2 g/dL (ref 11.1–15.9)
MCH: 30.6 pg (ref 26.6–33.0)
MCHC: 33.7 g/dL (ref 31.5–35.7)
MCV: 91 fL (ref 79–97)
Platelets: 304 10*3/uL (ref 150–450)
RBC: 4.97 x10E6/uL (ref 3.77–5.28)
RDW: 12.5 % (ref 11.7–15.4)
WBC: 5.9 10*3/uL (ref 3.4–10.8)

## 2020-01-01 LAB — VITAMIN B12: Vitamin B-12: 480 pg/mL (ref 232–1245)

## 2020-01-01 LAB — THYROID PANEL WITH TSH
Free Thyroxine Index: 2 (ref 1.2–4.9)
T3 Uptake Ratio: 26 % (ref 24–39)
T4, Total: 7.6 ug/dL (ref 4.5–12.0)
TSH: 1.5 u[IU]/mL (ref 0.450–4.500)

## 2020-01-01 LAB — VITAMIN D 25 HYDROXY (VIT D DEFICIENCY, FRACTURES): Vit D, 25-Hydroxy: 16.8 ng/mL — ABNORMAL LOW (ref 30.0–100.0)

## 2020-01-02 ENCOUNTER — Other Ambulatory Visit: Payer: Self-pay | Admitting: Obstetrics and Gynecology

## 2020-01-02 MED ORDER — VITAMIN D (ERGOCALCIFEROL) 1.25 MG (50000 UNIT) PO CAPS: 50000.0000 [IU] | ORAL_CAPSULE | ORAL | 0 refills | Status: DC

## 2020-01-04 NOTE — Addendum Note (Signed)
Addended by: Lorrene Reid on: 01/04/2020 01:31 PM   Modules accepted: Level of Service

## 2020-01-11 ENCOUNTER — Other Ambulatory Visit: Payer: Self-pay | Admitting: Family Medicine

## 2020-01-11 DIAGNOSIS — F988 Other specified behavioral and emotional disorders with onset usually occurring in childhood and adolescence: Secondary | ICD-10-CM

## 2020-01-11 MED ORDER — AMPHETAMINE-DEXTROAMPHETAMINE 20 MG PO TABS: 20.0000 mg | ORAL_TABLET | Freq: Two times a day (BID) | ORAL | 0 refills | Status: DC

## 2020-01-11 NOTE — Telephone Encounter (Signed)
Please review. Thanks!  

## 2020-01-12 ENCOUNTER — Other Ambulatory Visit: Payer: Self-pay | Admitting: Psychiatry

## 2020-01-12 DIAGNOSIS — F411 Generalized anxiety disorder: Secondary | ICD-10-CM | POA: Diagnosis not present

## 2020-01-12 DIAGNOSIS — F329 Major depressive disorder, single episode, unspecified: Secondary | ICD-10-CM | POA: Diagnosis not present

## 2020-01-12 DIAGNOSIS — F1021 Alcohol dependence, in remission: Secondary | ICD-10-CM | POA: Diagnosis not present

## 2020-01-12 DIAGNOSIS — R41840 Attention and concentration deficit: Secondary | ICD-10-CM | POA: Diagnosis not present

## 2020-01-12 DIAGNOSIS — F5105 Insomnia due to other mental disorder: Secondary | ICD-10-CM | POA: Diagnosis not present

## 2020-01-15 ENCOUNTER — Other Ambulatory Visit: Payer: Self-pay | Admitting: Psychiatry

## 2020-01-26 ENCOUNTER — Other Ambulatory Visit: Payer: Self-pay | Admitting: Psychiatry

## 2020-01-26 DIAGNOSIS — F5105 Insomnia due to other mental disorder: Secondary | ICD-10-CM | POA: Diagnosis not present

## 2020-01-26 DIAGNOSIS — F329 Major depressive disorder, single episode, unspecified: Secondary | ICD-10-CM | POA: Diagnosis not present

## 2020-01-26 DIAGNOSIS — R41840 Attention and concentration deficit: Secondary | ICD-10-CM | POA: Diagnosis not present

## 2020-01-26 DIAGNOSIS — F1021 Alcohol dependence, in remission: Secondary | ICD-10-CM | POA: Diagnosis not present

## 2020-01-26 DIAGNOSIS — F411 Generalized anxiety disorder: Secondary | ICD-10-CM | POA: Diagnosis not present

## 2020-02-08 ENCOUNTER — Other Ambulatory Visit: Payer: Self-pay | Admitting: Family Medicine

## 2020-02-08 DIAGNOSIS — F988 Other specified behavioral and emotional disorders with onset usually occurring in childhood and adolescence: Secondary | ICD-10-CM

## 2020-02-08 MED ORDER — AMPHETAMINE-DEXTROAMPHETAMINE 20 MG PO TABS: 20.0000 mg | ORAL_TABLET | Freq: Two times a day (BID) | ORAL | 0 refills | Status: DC

## 2020-02-12 NOTE — Progress Notes (Deleted)
  [facility] [address] [place] [address] Phone: [phone] Subjective:    I'm seeing this patient by the request  of:  Malva Limes, MD  CC:   [id number]  Tracey Robinson is a 29 y.o. female coming in with complaint of back and neck pain. OMT 12/08/2019. Patient states   Medications patient has been prescribed: None Taking:         Reviewed prior external information including notes and imaging from previsou exam, outside providers and external EMR if available.   As well as notes that were available from care everywhere and other healthcare systems.  Past medical history, social, surgical and family history all reviewed in electronic medical record.  No pertanent information unless stated regarding to the chief complaint.   Past Medical History:  Diagnosis Date  . ADHD   . Anxiety   . Panic attack     Allergies  Allergen Reactions  . Penicillins Rash     Review of Systems:  No headache, visual changes, nausea, vomiting, diarrhea, constipation, dizziness, abdominal pain, skin rash, fevers, chills, night sweats, weight loss, swollen lymph nodes, body aches, joint swelling, chest pain, shortness of breath, mood changes. POSITIVE muscle aches  Objective  There were no vitals taken for this visit.   General: No apparent distress alert and oriented x3 mood and affect normal, dressed appropriately.  HEENT: Pupils equal, extraocular movements intact  Respiratory: Patient's speak in full sentences and does not appear short of breath  Cardiovascular: No lower extremity edema, non tender, no erythema  Neuro: Cranial nerves II through XII are intact, neurovascularly intact in all extremities with 2+ DTRs and 2+ pulses.  Gait normal with good balance and coordination.  MSK:  Non tender with full range of motion and good stability and symmetric strength and tone of shoulders, elbows, wrist, hip, knee and ankles bilaterally.   Back - Normal skin, Spine with normal alignment and no deformity.  No tenderness to vertebral process palpation.  Paraspinous muscles are not tender and without spasm.   Range of motion is full at neck and lumbar sacral regions  Osteopathic findings  C2 flexed rotated and side bent right C6 flexed rotated and side bent left T3 extended rotated and side bent right inhaled rib T9 extended rotated and side bent left L2 flexed rotated and side bent right Sacrum right on right       Assessment and Plan:    Nonallopathic problems  Decision today to treat with OMT was based on Physical Exam  After verbal consent patient was treated with HVLA, ME, FPR techniques in cervical, rib, thoracic, lumbar, and sacral  areas  Patient tolerated the procedure well with improvement in symptoms  Patient given exercises, stretches and lifestyle modifications  See medications in patient instructions if given  Patient will follow up in 4-8 weeks      The above documentation has been reviewed and is accurate and complete Wilford Grist       Note: This dictation was prepared with Dragon dictation along with smaller phrase technology. Any transcriptional errors that result from this process are unintentional.

## 2020-02-13 ENCOUNTER — Ambulatory Visit: Payer: 59 | Admitting: Family Medicine

## 2020-02-15 DIAGNOSIS — F411 Generalized anxiety disorder: Secondary | ICD-10-CM | POA: Diagnosis not present

## 2020-02-15 DIAGNOSIS — F5105 Insomnia due to other mental disorder: Secondary | ICD-10-CM | POA: Diagnosis not present

## 2020-02-15 DIAGNOSIS — R41840 Attention and concentration deficit: Secondary | ICD-10-CM | POA: Diagnosis not present

## 2020-02-15 DIAGNOSIS — F1021 Alcohol dependence, in remission: Secondary | ICD-10-CM | POA: Diagnosis not present

## 2020-02-15 DIAGNOSIS — F329 Major depressive disorder, single episode, unspecified: Secondary | ICD-10-CM | POA: Diagnosis not present

## 2020-03-05 ENCOUNTER — Telehealth: Payer: 59 | Admitting: Physician Assistant

## 2020-03-05 ENCOUNTER — Other Ambulatory Visit: Payer: Self-pay | Admitting: Obstetrics and Gynecology

## 2020-03-05 ENCOUNTER — Encounter: Payer: Self-pay | Admitting: Obstetrics and Gynecology

## 2020-03-05 DIAGNOSIS — R319 Hematuria, unspecified: Secondary | ICD-10-CM

## 2020-03-05 DIAGNOSIS — N390 Urinary tract infection, site not specified: Secondary | ICD-10-CM

## 2020-03-05 MED ORDER — NITROFURANTOIN MONOHYD MACRO 100 MG PO CAPS: 100.0000 mg | ORAL_CAPSULE | Freq: Two times a day (BID) | ORAL | 0 refills | Status: DC

## 2020-03-05 NOTE — Progress Notes (Signed)
We are sorry that you are not feeling well.  Here is how we plan to help!  Based on what you shared with me it looks like you most likely have a simple urinary tract infection.  A UTI (Urinary Tract Infection) is a bacterial infection of the bladder.  Most cases of urinary tract infections are simple to treat but a key part of your care is to encourage you to drink plenty of fluids and watch your symptoms carefully.  I have prescribed MacroBid 100 mg twice a day for 5 days.  Your symptoms should gradually improve. Call us if the burning in your urine worsens, you develop worsening fever, back pain or pelvic pain or if your symptoms do not resolve after completing the antibiotic.  Urinary tract infections can be prevented by drinking plenty of water to keep your body hydrated.  Also be sure when you wipe, wipe from front to back and don't hold it in!  If possible, empty your bladder every 4 hours.  Your e-visit answers were reviewed by a board certified advanced clinical practitioner to complete your personal care plan.  Depending on the condition, your plan could have included both over the counter or prescription medications.  If there is a problem please reply  once you have received a response from your provider.  Your safety is important to us.  If you have drug allergies check your prescription carefully.    You can use MyChart to ask questions about today's visit, request a non-urgent call back, or ask for a work or school excuse for 24 hours related to this e-Visit. If it has been greater than 24 hours you will need to follow up with your provider, or enter a new e-Visit to address those concerns.   You will get an e-mail in the next two days asking about your experience.  I hope that your e-visit has been valuable and will speed your recovery. Thank you for using e-visits.   Greater than 5 minutes, yet less than 10 minutes of time have been spent researching, coordinating, and  implementing care for this patient today  

## 2020-03-12 ENCOUNTER — Other Ambulatory Visit: Payer: Self-pay | Admitting: Psychiatry

## 2020-03-12 ENCOUNTER — Other Ambulatory Visit: Payer: Self-pay | Admitting: Family Medicine

## 2020-03-12 DIAGNOSIS — F988 Other specified behavioral and emotional disorders with onset usually occurring in childhood and adolescence: Secondary | ICD-10-CM

## 2020-03-12 MED ORDER — AMPHETAMINE-DEXTROAMPHETAMINE 20 MG PO TABS: 20.0000 mg | ORAL_TABLET | Freq: Two times a day (BID) | ORAL | 0 refills | Status: DC

## 2020-03-14 ENCOUNTER — Ambulatory Visit (INDEPENDENT_AMBULATORY_CARE_PROVIDER_SITE_OTHER): Payer: Self-pay

## 2020-03-14 ENCOUNTER — Other Ambulatory Visit: Payer: Self-pay

## 2020-03-14 DIAGNOSIS — R300 Dysuria: Secondary | ICD-10-CM

## 2020-03-14 LAB — POCT URINALYSIS DIPSTICK
Bilirubin, UA: NEGATIVE
Blood, UA: NEGATIVE
Glucose, UA: NEGATIVE
Ketones, UA: NEGATIVE
Leukocytes, UA: NEGATIVE
Nitrite, UA: NEGATIVE
Protein, UA: NEGATIVE
Spec Grav, UA: 1.02 (ref 1.010–1.025)
Urobilinogen, UA: 2 E.U./dL — AB
pH, UA: 5 (ref 5.0–8.0)

## 2020-03-15 ENCOUNTER — Other Ambulatory Visit: Payer: Self-pay | Admitting: Obstetrics and Gynecology

## 2020-03-15 ENCOUNTER — Other Ambulatory Visit: Payer: Self-pay

## 2020-03-15 DIAGNOSIS — N390 Urinary tract infection, site not specified: Secondary | ICD-10-CM

## 2020-03-17 LAB — URINE CULTURE

## 2020-03-30 ENCOUNTER — Other Ambulatory Visit: Payer: Self-pay | Admitting: Psychiatry

## 2020-04-04 ENCOUNTER — Encounter: Payer: Self-pay | Admitting: Family Medicine

## 2020-04-04 ENCOUNTER — Other Ambulatory Visit: Payer: Self-pay

## 2020-04-04 ENCOUNTER — Ambulatory Visit (INDEPENDENT_AMBULATORY_CARE_PROVIDER_SITE_OTHER): Payer: No Typology Code available for payment source | Admitting: Family Medicine

## 2020-04-04 VITALS — BP 111/83 | HR 105 | Temp 98.4°F | Wt 129.0 lb

## 2020-04-04 DIAGNOSIS — Z021 Encounter for pre-employment examination: Secondary | ICD-10-CM

## 2020-04-04 DIAGNOSIS — F988 Other specified behavioral and emotional disorders with onset usually occurring in childhood and adolescence: Secondary | ICD-10-CM | POA: Diagnosis not present

## 2020-04-04 DIAGNOSIS — Z0289 Encounter for other administrative examinations: Secondary | ICD-10-CM

## 2020-04-04 NOTE — Progress Notes (Signed)
Established patient visit   Patient: Tracey Robinson   DOB: Oct 12, 1990   29 y.o. Female  MRN: [id number] Visit Date: 04/04/2020  Today's healthcare provider: Dortha Kern, PA-C   No chief complaint on file.  Subjective    HPI  Patient has a form that she would like to have filled out for new employment.    Past Medical History:  Diagnosis Date   ADHD    Anxiety    Panic attack    Past Surgical History:  Procedure Laterality Date   NO PAST SURGERIES     Family History  Problem Relation Age of Onset   Hyperlipidemia Mother    Ulcers Father    Heart attack Father    Anxiety disorder Father    Heart attack Paternal Grandfather    Diabetes Paternal Grandfather    Anxiety disorder Paternal Grandmother    Social History   Tobacco Use   Smoking status: Never Smoker   Smokeless tobacco: Never Used  Vaping Use   Vaping Use: Never used  Substance Use Topics   Alcohol use: Yes    Alcohol/week: 0.0 standard drinks    Comment: occasional glass of wine   Drug use: No   Allergies  Allergen Reactions   Penicillins Rash    Medications: Outpatient Medications Prior to Visit  Medication Sig   amphetamine-dextroamphetamine (ADDERALL) 20 MG tablet Take 1 tablet (20 mg total) by mouth 2 (two) times daily.   Cyanocobalamin 3000 MCG CAPS Take by mouth.   fluticasone (FLONASE) 50 MCG/ACT nasal spray Place into both nostrils as needed for allergies or rhinitis.   metroNIDAZOLE (METROGEL VAGINAL) 0.75 % vaginal gel Place 1 Applicatorful vaginally 2 (two) times daily.   nitrofurantoin, macrocrystal-monohydrate, (MACROBID) 100 MG capsule Take 1 capsule (100 mg total) by mouth 2 (two) times daily. X 7 days   traZODone (DESYREL) 100 MG tablet 50-100 mg.    No facility-administered medications prior to visit.    Review of Systems  Constitutional: Negative.   HENT: Negative.   Eyes: Negative.   Respiratory: Negative.   Cardiovascular: Negative.    Gastrointestinal: Negative.   Endocrine: Negative.   Genitourinary: Negative.   Musculoskeletal: Negative.   Skin: Negative.   Neurological: Negative.   Hematological: Negative.   Psychiatric/Behavioral: Negative.        Objective    BP 111/83 (BP Location: Right Arm, Patient Position: Sitting, Cuff Size: Normal)    Pulse (!) 105    Temp 98.4 F (36.9 C) (Oral)    Wt 129 lb (58.5 kg)    SpO2 96%    BMI 21.47 kg/m  LMP - 03-17-20    Physical Exam Constitutional:      General: She is not in acute distress.    Appearance: She is well-developed and well-nourished.  HENT:     Head: Normocephalic and atraumatic.     Right Ear: Hearing and tympanic membrane normal.     Left Ear: Hearing and tympanic membrane normal.     Nose: Nose normal.     Mouth/Throat:     Mouth: Mucous membranes are moist.     Pharynx: Oropharynx is clear.  Eyes:     General: Lids are normal. No scleral icterus.       Right eye: No discharge.        Left eye: No discharge.     Conjunctiva/sclera: Conjunctivae normal.  Cardiovascular:     Rate and Rhythm: Tachycardia present.  Heart sounds: Normal heart sounds.  Pulmonary:     Effort: Pulmonary effort is normal. No respiratory distress.     Breath sounds: Normal breath sounds.  Abdominal:     General: Bowel sounds are normal.     Palpations: Abdomen is soft.  Musculoskeletal:        General: Normal range of motion.     Cervical back: Normal range of motion and neck supple.  Skin:    General: Skin is warm, dry and intact.     Findings: No lesion or rash.  Neurological:     Mental Status: She is alert and oriented to person, place, and time.  Psychiatric:        Mood and Affect: Mood and affect normal.        Speech: Speech normal.        Behavior: Behavior normal.        Thought Content: Thought content normal.     No results found for any visits on 04/04/20.  Assessment & Plan     1. 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 No sign of communicable diseases and in good general health. Immunizations up to date.  2. Attention deficit disorder (ADD) without hyperactivity Stable with Adderall and follow up with psychiatrist (Dr. Maryruth Bun)  regularly for anxiety disorder.   No follow-ups on file.      I, Johnette Teigen, PA-C, have reviewed all documentation for this visit. The documentation on 04/04/20 for the exam, diagnosis, procedures, and orders are all accurate and complete.    Dortha Kern, PA-C  [facility] [phone] (phone) [phone] (fax)  [facility]

## 2020-04-09 ENCOUNTER — Other Ambulatory Visit: Payer: Self-pay | Admitting: Family Medicine

## 2020-04-09 DIAGNOSIS — F988 Other specified behavioral and emotional disorders with onset usually occurring in childhood and adolescence: Secondary | ICD-10-CM

## 2020-04-09 MED ORDER — AMPHETAMINE-DEXTROAMPHETAMINE 20 MG PO TABS
20.0000 mg | ORAL_TABLET | Freq: Two times a day (BID) | ORAL | 0 refills | Status: DC
Start: 2020-04-09 — End: 2020-05-07

## 2020-04-10 ENCOUNTER — Other Ambulatory Visit: Payer: Self-pay | Admitting: Psychiatry

## 2020-05-07 ENCOUNTER — Other Ambulatory Visit: Payer: Self-pay | Admitting: Family Medicine

## 2020-05-07 DIAGNOSIS — F988 Other specified behavioral and emotional disorders with onset usually occurring in childhood and adolescence: Secondary | ICD-10-CM

## 2020-05-08 ENCOUNTER — Other Ambulatory Visit: Payer: Self-pay | Admitting: Family Medicine

## 2020-05-08 MED ORDER — AMPHETAMINE-DEXTROAMPHETAMINE 20 MG PO TABS: 20.0000 mg | ORAL_TABLET | Freq: Two times a day (BID) | ORAL | 0 refills | Status: DC

## 2020-05-28 ENCOUNTER — Other Ambulatory Visit: Payer: Self-pay | Admitting: Psychiatry

## 2020-05-29 ENCOUNTER — Telehealth: Payer: No Typology Code available for payment source | Admitting: Family

## 2020-05-29 DIAGNOSIS — R399 Unspecified symptoms and signs involving the genitourinary system: Secondary | ICD-10-CM | POA: Diagnosis not present

## 2020-05-29 MED ORDER — NITROFURANTOIN MONOHYD MACRO 100 MG PO CAPS: 100.0000 mg | ORAL_CAPSULE | Freq: Two times a day (BID) | ORAL | 0 refills | Status: DC

## 2020-05-29 NOTE — Progress Notes (Signed)

## 2020-06-04 ENCOUNTER — Other Ambulatory Visit: Payer: Self-pay | Admitting: Family Medicine

## 2020-06-04 DIAGNOSIS — F988 Other specified behavioral and emotional disorders with onset usually occurring in childhood and adolescence: Secondary | ICD-10-CM

## 2020-06-04 MED ORDER — AMPHETAMINE-DEXTROAMPHETAMINE 20 MG PO TABS: 20.0000 mg | ORAL_TABLET | Freq: Two times a day (BID) | ORAL | 0 refills | Status: DC

## 2020-07-08 ENCOUNTER — Other Ambulatory Visit: Payer: Self-pay | Admitting: Family Medicine

## 2020-07-08 DIAGNOSIS — F988 Other specified behavioral and emotional disorders with onset usually occurring in childhood and adolescence: Secondary | ICD-10-CM

## 2020-07-08 NOTE — Telephone Encounter (Signed)
Requested medications are due for refill today.  yes  Requested medications are on the active medications list.  yes  Last refill. 06/04/2020  Future visit scheduled.   no  Notes to clinic.  Medication not delegated. 

## 2020-07-09 ENCOUNTER — Other Ambulatory Visit: Payer: Self-pay

## 2020-07-09 MED ORDER — AMPHETAMINE-DEXTROAMPHETAMINE 20 MG PO TABS
20.0000 mg | ORAL_TABLET | Freq: Two times a day (BID) | ORAL | 0 refills | Status: DC
  Filled 2020-07-09: qty 60, 30d supply, fill #0

## 2020-08-02 ENCOUNTER — Other Ambulatory Visit: Payer: Self-pay

## 2020-08-02 MED ORDER — FLUOXETINE HCL 20 MG PO CAPS
ORAL_CAPSULE | Freq: Every day | ORAL | 0 refills | Status: AC
  Filled 2020-08-02: qty 90, 90d supply, fill #0

## 2020-08-09 ENCOUNTER — Other Ambulatory Visit: Payer: Self-pay

## 2020-08-09 ENCOUNTER — Other Ambulatory Visit: Payer: Self-pay | Admitting: Family Medicine

## 2020-08-09 DIAGNOSIS — F988 Other specified behavioral and emotional disorders with onset usually occurring in childhood and adolescence: Secondary | ICD-10-CM

## 2020-08-09 MED FILL — Amphetamine-Dextroamphetamine Tab 20 MG: ORAL | 30 days supply | Qty: 60 | Fill #0 | Status: AC

## 2020-08-09 NOTE — Telephone Encounter (Signed)
   Notes to clinic: 2nd request    Requested Prescriptions  Pending Prescriptions Disp Refills   amphetamine-dextroamphetamine (ADDERALL) 20 MG tablet 60 tablet 0    Sig: Take 1 tablet (20 mg total) by mouth 2 (two) times daily.      Not Delegated - Psychiatry:  Stimulants/ADHD Failed - 08/09/2020 11:03 AM      Failed - This refill cannot be delegated      Failed - Urine Drug Screen completed in last 360 days      Failed - Valid encounter within last 3 months    Recent Outpatient Visits           4 months ago Encounter for physical examination related to employment   [employer], Jodell Cipro, PA-C   8 months ago Bacterial vaginosis   [employer], Jodell Cipro, PA-C   1 year ago Dog bite, initial encounter   [employer], Lavella Hammock, PA-C   1 year ago Attention deficit disorder (ADD) without hyperactivity   [facility] Malva Limes, MD   1 year ago Attention deficit disorder (ADD) without hyperactivity   [facility] Malva Limes, MD

## 2020-08-09 NOTE — Telephone Encounter (Signed)
Requested medication (s) are due for refill today: yes  Requested medication (s) are on the active medication list: yes   Last refill:  07/09/2020  Future visit scheduled:no   Notes to clinic: this refill cannot be delegated  Overdue for 3 month follow up    Requested Prescriptions  Pending Prescriptions Disp Refills   amphetamine-dextroamphetamine (ADDERALL) 20 MG tablet 60 tablet 0    Sig: Take 1 tablet (20 mg total) by mouth 2 (two) times daily.      Not Delegated - Psychiatry:  Stimulants/ADHD Failed - 08/09/2020  9:12 AM      Failed - This refill cannot be delegated      Failed - Urine Drug Screen completed in last 360 days      Failed - Valid encounter within last 3 months    Recent Outpatient Visits           4 months ago Encounter for physical examination related to employment   [employer], Jodell Cipro, PA-C   8 months ago Bacterial vaginosis   [employer], Jodell Cipro, PA-C   1 year ago Dog bite, initial encounter   [employer], Lavella Hammock, PA-C   1 year ago Attention deficit disorder (ADD) without hyperactivity   [facility] Malva Limes, MD   1 year ago Attention deficit disorder (ADD) without hyperactivity   [facility] Malva Limes, MD

## 2020-09-09 ENCOUNTER — Other Ambulatory Visit: Payer: Self-pay | Admitting: Family Medicine

## 2020-09-09 DIAGNOSIS — F988 Other specified behavioral and emotional disorders with onset usually occurring in childhood and adolescence: Secondary | ICD-10-CM

## 2020-09-10 MED ORDER — AMPHETAMINE-DEXTROAMPHETAMINE 20 MG PO TABS
20.0000 mg | ORAL_TABLET | Freq: Two times a day (BID) | ORAL | 0 refills | Status: DC
Start: 2020-09-10 — End: 2020-10-08

## 2020-10-08 ENCOUNTER — Other Ambulatory Visit: Payer: Self-pay | Admitting: Family Medicine

## 2020-10-08 DIAGNOSIS — F988 Other specified behavioral and emotional disorders with onset usually occurring in childhood and adolescence: Secondary | ICD-10-CM

## 2020-10-08 MED ORDER — AMPHETAMINE-DEXTROAMPHETAMINE 20 MG PO TABS: 20.0000 mg | ORAL_TABLET | Freq: Two times a day (BID) | ORAL | 0 refills | Status: DC

## 2020-11-08 ENCOUNTER — Other Ambulatory Visit: Payer: Self-pay | Admitting: Family Medicine

## 2020-11-08 DIAGNOSIS — F988 Other specified behavioral and emotional disorders with onset usually occurring in childhood and adolescence: Secondary | ICD-10-CM

## 2020-11-10 MED ORDER — AMPHETAMINE-DEXTROAMPHETAMINE 20 MG PO TABS: 20.0000 mg | ORAL_TABLET | Freq: Two times a day (BID) | ORAL | 0 refills | Status: DC

## 2020-11-18 ENCOUNTER — Telehealth: Payer: No Typology Code available for payment source | Admitting: Physician Assistant

## 2020-11-18 DIAGNOSIS — R3989 Other symptoms and signs involving the genitourinary system: Secondary | ICD-10-CM

## 2020-11-18 MED ORDER — SULFAMETHOXAZOLE-TRIMETHOPRIM 800-160 MG PO TABS: 1.0000 | ORAL_TABLET | Freq: Two times a day (BID) | ORAL | 0 refills | Status: AC

## 2020-11-18 NOTE — Progress Notes (Signed)

## 2020-11-21 ENCOUNTER — Telehealth: Payer: No Typology Code available for payment source | Admitting: Emergency Medicine

## 2020-11-21 DIAGNOSIS — R102 Pelvic and perineal pain: Secondary | ICD-10-CM

## 2020-11-21 NOTE — Progress Notes (Signed)
E-Visit for Vaginal Symptoms  We are sorry that you are not feeling well. Here is how we plan to help! Based on what you shared with me it looks like you: May have a vaginosis due to bacteria  Vaginosis is an inflammation of the vagina that can result in discharge, itching and pain. The cause is usually a change in the normal balance of vaginal bacteria or an infection. Vaginosis can also result from reduced estrogen levels after menopause.  The most common causes of vaginosis are:   Bacterial vaginosis which results from an overgrowth of one on several organisms that are normally present in your vagina.   Yeast infections which are caused by a naturally occurring fungus called candida.   Vaginal atrophy (atrophic vaginosis) which results from the thinning of the vagina from reduced estrogen levels after menopause.   Trichomoniasis which is caused by a parasite and is commonly transmitted by sexual intercourse.  Factors that increase your risk of developing vaginosis include: Medications, such as antibiotics and steroids Uncontrolled diabetes Use of hygiene products such as bubble bath, vaginal spray or vaginal deodorant Douching Wearing damp or tight-fitting clothing Using an intrauterine device (IUD) for birth control Hormonal changes, such as those associated with pregnancy, birth control pills or menopause Sexual activity Having a sexually transmitted infection  Your treatment plan is Metronidazole or Flagyl 500mg  twice a day for 7 days.  I have electronically sent this prescription into the pharmacy that you have chosen.  If this treatment doesn't work, you need to be seen in clinic and have a pelvic exam and cultures collected.  Be sure to take all of the medication as directed. Stop taking any medication if you develop a rash, tongue swelling or shortness of breath. Mothers who are breast feeding should consider pumping and discarding their breast milk while on these  antibiotics. However, there is no consensus that infant exposure at these doses would be harmful.  Remember that medication creams can weaken latex condoms.   HOME CARE:  Good hygiene may prevent some types of vaginosis from recurring and may relieve some symptoms:  Avoid baths, hot tubs and whirlpool spas. Rinse soap from your outer genital area after a shower, and dry the area well to prevent irritation. Don't use scented or harsh soaps, such as those with deodorant or antibacterial action. Avoid irritants. These include scented tampons and pads. Wipe from front to back after using the toilet. Doing so avoids spreading fecal bacteria to your vagina.  Other things that may help prevent vaginosis include:  Don't douche. Your vagina doesn't require cleansing other than normal bathing. Repetitive douching disrupts the normal organisms that reside in the vagina and can actually increase your risk of vaginal infection. Douching won't clear up a vaginal infection. Use a latex condom. Both female and female latex condoms may help you avoid infections spread by sexual contact. Wear cotton underwear. Also wear pantyhose with a cotton crotch. If you feel comfortable without it, skip wearing underwear to bed. Yeast thrives in [place] Your symptoms should improve in the next day or two.  GET HELP RIGHT AWAY IF:  You have pain in your lower abdomen ( pelvic area or over your ovaries) You develop nausea or vomiting You develop a fever Your discharge changes or worsens You have persistent pain with intercourse You develop shortness of breath, a rapid pulse, or you faint.  These symptoms could be signs of problems or infections that need to be evaluated by  a medical provider now.  MAKE SURE YOU   Understand these instructions. Will watch your condition. Will get help right away if you are not doing well or get worse.  Thank you for choosing an e-visit.  Your e-visit answers were  reviewed by a board certified advanced clinical practitioner to complete your personal care plan. Depending upon the condition, your plan could have included both over the counter or prescription medications.  Please review your pharmacy choice. Make sure the pharmacy is open so you can pick up prescription now. If there is a problem, you may contact your provider through [employer] and have the prescription routed to another pharmacy.  Your safety is important to [place]. If you have drug allergies check your prescription carefully.   For the next 24 hours you can use MyChart to ask questions about today's visit, request a non-urgent call back, or ask for a work or school excuse. You will get an email in the next two days asking about your experience. I hope that your e-visit has been valuable and will speed your recovery.  Approximately 5 minutes was used in reviewing the patient's chart, questionnaire, prescribing medications, and documentation.

## 2020-12-09 ENCOUNTER — Other Ambulatory Visit: Payer: Self-pay | Admitting: Family Medicine

## 2020-12-09 DIAGNOSIS — F988 Other specified behavioral and emotional disorders with onset usually occurring in childhood and adolescence: Secondary | ICD-10-CM

## 2020-12-10 MED ORDER — AMPHETAMINE-DEXTROAMPHETAMINE 20 MG PO TABS: 20.0000 mg | ORAL_TABLET | Freq: Two times a day (BID) | ORAL | 0 refills | Status: DC

## 2021-01-10 ENCOUNTER — Telehealth: Payer: No Typology Code available for payment source | Admitting: Nurse Practitioner

## 2021-01-10 ENCOUNTER — Other Ambulatory Visit: Payer: Self-pay | Admitting: Family Medicine

## 2021-01-10 DIAGNOSIS — F988 Other specified behavioral and emotional disorders with onset usually occurring in childhood and adolescence: Secondary | ICD-10-CM

## 2021-01-10 DIAGNOSIS — N3000 Acute cystitis without hematuria: Secondary | ICD-10-CM | POA: Diagnosis not present

## 2021-01-10 MED ORDER — NITROFURANTOIN MONOHYD MACRO 100 MG PO CAPS: 100.0000 mg | ORAL_CAPSULE | Freq: Two times a day (BID) | ORAL | 0 refills | Status: AC

## 2021-01-10 MED ORDER — AMPHETAMINE-DEXTROAMPHETAMINE 20 MG PO TABS: 20.0000 mg | ORAL_TABLET | Freq: Two times a day (BID) | ORAL | 0 refills | Status: DC

## 2021-01-10 NOTE — Progress Notes (Signed)

## 2021-02-20 ENCOUNTER — Other Ambulatory Visit: Payer: Self-pay | Admitting: Family Medicine

## 2021-02-20 DIAGNOSIS — F988 Other specified behavioral and emotional disorders with onset usually occurring in childhood and adolescence: Secondary | ICD-10-CM

## 2021-02-21 MED ORDER — AMPHETAMINE-DEXTROAMPHETAMINE 20 MG PO TABS: 20.0000 mg | ORAL_TABLET | Freq: Two times a day (BID) | ORAL | 0 refills | Status: DC

## 2021-02-22 ENCOUNTER — Telehealth: Payer: No Typology Code available for payment source | Admitting: Nurse Practitioner

## 2021-02-22 DIAGNOSIS — J40 Bronchitis, not specified as acute or chronic: Secondary | ICD-10-CM

## 2021-02-22 MED ORDER — AZITHROMYCIN 250 MG PO TABS: ORAL_TABLET | ORAL | 0 refills | Status: AC

## 2021-02-22 MED ORDER — BENZONATATE 100 MG PO CAPS: 100.0000 mg | ORAL_CAPSULE | Freq: Three times a day (TID) | ORAL | 0 refills | Status: AC | PRN

## 2021-02-22 MED ORDER — PREDNISONE 10 MG (21) PO TBPK: ORAL_TABLET | ORAL | 0 refills | Status: AC

## 2021-02-22 NOTE — Progress Notes (Signed)
We are sorry that you are not feeling well.  Here is how we plan to help!  Based on your presentation I believe you most likely have A cough due to bacteria.  When patients have a fever and a productive cough with a change in color or increased sputum production, we are concerned about bacterial bronchitis.  If left untreated it can progress to pneumonia.  If your symptoms do not improve with your treatment plan it is important that you contact your provider.   I have prescribed Azithromyin 250 mg: two tablets now and then one tablet daily for 4 additonal days    In addition you may use A prescription cough medication called Tessalon Perles 100mg. You may take 1-2 capsules every 8 hours as needed for your cough.  Prednisone 10 mg daily for 6 days (see taper instructions below)  Directions for 6 day taper: Day 1: 2 tablets before breakfast, 1 after both lunch & dinner and 2 at bedtime Day 2: 1 tab before breakfast, 1 after both lunch & dinner and 2 at bedtime Day 3: 1 tab at each meal & 1 at bedtime Day 4: 1 tab at breakfast, 1 at lunch, 1 at bedtime Day 5: 1 tab at breakfast & 1 tab at bedtime Day 6: 1 tab at breakfast  From your responses in the eVisit questionnaire you describe inflammation in the upper respiratory tract which is causing a significant cough.  This is commonly called Bronchitis and has four common causes:   Allergies Viral Infections Acid Reflux Bacterial Infection Allergies, viruses and acid reflux are treated by controlling symptoms or eliminating the cause. An example might be a cough caused by taking certain blood pressure medications. You stop the cough by changing the medication. Another example might be a cough caused by acid reflux. Controlling the reflux helps control the cough.  USE OF BRONCHODILATOR ("RESCUE") INHALERS: There is a risk from using your bronchodilator too frequently.  The risk is that over-reliance on a medication which only relaxes the muscles  surrounding the breathing tubes can reduce the effectiveness of medications prescribed to reduce swelling and congestion of the tubes themselves.  Although you feel brief relief from the bronchodilator inhaler, your asthma may actually be worsening with the tubes becoming more swollen and filled with mucus.  This can delay other crucial treatments, such as oral steroid medications. If you need to use a bronchodilator inhaler daily, several times per day, you should discuss this with your provider.  There are probably better treatments that could be used to keep your asthma under control.     HOME CARE Only take medications as instructed by your medical team. Complete the entire course of an antibiotic. Drink plenty of fluids and get plenty of rest. Avoid close contacts especially the very young and the elderly Cover your mouth if you cough or cough into your sleeve. Always remember to wash your hands A steam or ultrasonic humidifier can help congestion.   GET HELP RIGHT AWAY IF: You develop worsening fever. You become short of breath You cough up blood. Your symptoms persist after you have completed your treatment plan MAKE SURE YOU  Understand these instructions. Will watch your condition. Will get help right away if you are not doing well or get worse.    Thank you for choosing an e-visit.  Your e-visit answers were reviewed by a board certified advanced clinical practitioner to complete your personal care plan. Depending upon the condition, your plan could   have included both over the counter or prescription medications.  Please review your pharmacy choice. Make sure the pharmacy is open so you can pick up prescription now. If there is a problem, you may contact your provider through MyChart messaging and have the prescription routed to another pharmacy.  Your safety is important to us. If you have drug allergies check your prescription carefully.   For the next 24 hours you can use  MyChart to ask questions about today's visit, request a non-urgent call back, or ask for a work or school excuse. You will get an email in the next two days asking about your experience. I hope that your e-visit has been valuable and will speed your recovery.  5-10 minutes spent reviewing and documenting in chart.  

## 2021-03-24 ENCOUNTER — Other Ambulatory Visit: Payer: Self-pay | Admitting: Family Medicine

## 2021-03-24 DIAGNOSIS — F988 Other specified behavioral and emotional disorders with onset usually occurring in childhood and adolescence: Secondary | ICD-10-CM

## 2021-04-02 ENCOUNTER — Encounter: Payer: Self-pay | Admitting: Family Medicine

## 2021-04-02 ENCOUNTER — Other Ambulatory Visit: Payer: Self-pay | Admitting: Family Medicine

## 2021-04-02 DIAGNOSIS — F988 Other specified behavioral and emotional disorders with onset usually occurring in childhood and adolescence: Secondary | ICD-10-CM

## 2021-04-02 MED ORDER — AMPHETAMINE-DEXTROAMPHETAMINE 20 MG PO TABS: 20.0000 mg | ORAL_TABLET | Freq: Two times a day (BID) | ORAL | 0 refills | Status: DC

## 2021-05-25 ENCOUNTER — Other Ambulatory Visit: Payer: Self-pay | Admitting: Family Medicine

## 2021-05-25 DIAGNOSIS — F988 Other specified behavioral and emotional disorders with onset usually occurring in childhood and adolescence: Secondary | ICD-10-CM

## 2021-05-26 MED ORDER — AMPHETAMINE-DEXTROAMPHETAMINE 20 MG PO TABS: 20.0000 mg | ORAL_TABLET | Freq: Two times a day (BID) | ORAL | 0 refills | Status: DC

## 2021-05-26 NOTE — Telephone Encounter (Signed)
Patient overdue for follow up appointment. Last office visit was 04/04/2020. Courtesy refill was given 04/02/2021. Tried calling patient. Left message to call back to schedule follow up appointment. OK for [facility] to advise and schedule appointment. ?

## 2021-07-10 ENCOUNTER — Encounter: Payer: Self-pay | Admitting: Family Medicine

## 2021-07-16 ENCOUNTER — Other Ambulatory Visit: Payer: Self-pay | Admitting: Family Medicine

## 2021-07-16 DIAGNOSIS — F988 Other specified behavioral and emotional disorders with onset usually occurring in childhood and adolescence: Secondary | ICD-10-CM

## 2021-07-16 MED ORDER — AMPHETAMINE-DEXTROAMPHETAMINE 20 MG PO TABS: 20.0000 mg | ORAL_TABLET | Freq: Two times a day (BID) | ORAL | 0 refills | Status: AC
